# Patient Record
Sex: Male | Born: 1950 | Race: White | Marital: Single
Health system: Southern US, Community
[De-identification: ages and names within clinical notes are randomized; demographics above are authoritative.]

## PROBLEM LIST (undated history)

## (undated) DIAGNOSIS — M810 Age-related osteoporosis without current pathological fracture: Secondary | ICD-10-CM

## (undated) DIAGNOSIS — R569 Unspecified convulsions: Secondary | ICD-10-CM

## (undated) DIAGNOSIS — F0390 Unspecified dementia without behavioral disturbance: Secondary | ICD-10-CM

---

## 2012-10-20 ENCOUNTER — Other Ambulatory Visit: Payer: Self-pay | Admitting: *Deleted

## 2012-10-20 MED ORDER — LORAZEPAM 1 MG PO TABS: ORAL_TABLET | ORAL | Status: DC

## 2012-10-27 ENCOUNTER — Non-Acute Institutional Stay (SKILLED_NURSING_FACILITY): Payer: Medicare Other | Admitting: Internal Medicine

## 2012-10-27 DIAGNOSIS — F0150 Vascular dementia without behavioral disturbance: Secondary | ICD-10-CM

## 2012-10-27 DIAGNOSIS — F411 Generalized anxiety disorder: Secondary | ICD-10-CM

## 2012-10-27 DIAGNOSIS — F3289 Other specified depressive episodes: Secondary | ICD-10-CM

## 2012-10-27 DIAGNOSIS — F329 Major depressive disorder, single episode, unspecified: Secondary | ICD-10-CM

## 2012-10-27 DIAGNOSIS — K5900 Constipation, unspecified: Secondary | ICD-10-CM

## 2012-11-16 NOTE — Progress Notes (Signed)
Patient ID: Matthew [name], male   DOB: 02-25-1951, 61 y.o.   MRN: [id number]        HISTORY & PHYSICAL  DATE:  10/27/2012  FACILITY: [facility]   LEVEL OF CARE: SNF   ALLERGIES:  NKDA.   CHIEF COMPLAINT:  Manage vascular dementia, depression and constipation.    HISTORY OF PRESENT ILLNESS:  62 year-old, Caucasian male is admitted to this facility for long-term care management.  He has the following problems:  DEMENTIA: The dementia remains stable and continues to function adequately in the current living environment with supervision.  The patient has had little changes in behavior. No complications noted from the medications presently being used.  Patient is a poor historian.  The dementia is vascular type and is advanced.    DEPRESSION: The depression remains stable. Staff denies ongoing feelings of sadness, insomnia, anedhonia or lack of appetite. No complications reported from the medications currently being used. Staff do not report behavioral problems.   CONSTIPATION: The constipation remains stable. No complications from the medications presently being used.  Staff denies ongoing constipation, abdominal pain, nausea or vomiting.   PAST MEDICAL HISTORY :   Vascular dementia.    Constipation.    Depression.    Anxiety.    PAST SURGICAL HISTORY: No past surgical history on file.  SOCIAL HISTORY: Unobtainable due to advanced dementia.   FAMILY HISTORY:  Unobtainable due to advanced dementia.   CURRENT MEDICATIONS: Reviewed per [facility]  REVIEW OF SYSTEMS:  Unobtainable due to advanced dementia.   PHYSICAL EXAMINATION  VS:  T  96.2      P 70      RR 18      BP 130/85     POX%        WT (Lb) 152  GENERAL: no acute distress, normal body habitus SKIN: warm & dry, no suspicious lesions or rashes, no excessive dryness EYES: conjunctivae normal, sclerae normal, normal eye lids MOUTH/THROAT: unable to assess  NECK: supple, trachea midline, no neck masses, no thyroid  tenderness, no thyromegaly LYMPHATICS: no LAN in the neck, no supraclavicular LAN RESPIRATORY: breathing is even & unlabored, BS CTAB CARDIAC: RRR, no murmur,no extra heart sounds, no edema GI:  ABDOMEN: abdomen soft, normal BS, no masses, no tenderness  LIVER/SPLEEN: no hepatomegaly, no splenomegaly MUSCULOSKELETAL: unable to assess  PSYCHIATRIC: the patient is alert, disoriented, affect & behavior appropriate  LABS/RADIOLOGY:  None received at admission.    ASSESSMENT/PLAN:  Vascular dementia.  Advanced.  Continue current medications.  Check TSH and vitamin B12 level.    Depression.  Continue Remeron.   Constipation.  Continue lactobacillus.    Anxiety.  Continue p.r.n. Lorazepam.   Check CBC and CMP.    I have reviewed patient's medical records received at admission/from hospitalization.  CPT CODE: 29528

## 2012-11-17 ENCOUNTER — Non-Acute Institutional Stay (SKILLED_NURSING_FACILITY): Payer: Medicare Other | Admitting: Internal Medicine

## 2012-11-17 DIAGNOSIS — F411 Generalized anxiety disorder: Secondary | ICD-10-CM

## 2012-11-17 DIAGNOSIS — F329 Major depressive disorder, single episode, unspecified: Secondary | ICD-10-CM

## 2012-11-17 DIAGNOSIS — K5900 Constipation, unspecified: Secondary | ICD-10-CM

## 2012-11-17 DIAGNOSIS — F0150 Vascular dementia without behavioral disturbance: Secondary | ICD-10-CM

## 2012-11-18 NOTE — Progress Notes (Signed)
PROGRESS NOTE  DATE: 11-17-12  FACILITY: [facility]  LEVEL OF CARE: SNF  Routine Visit  CHIEF COMPLAINT:  Manage vascular dementia, constipation and depression  HISTORY OF PRESENT ILLNESS:  REASSESSMENT OF ONGOING PROBLEM(S):  1. DEMENTIA: The dementia remaines stable and continues to function adequately in the current living environment with supervision.  The patient has had little changes in behavior. No complications noted from the medications presently being used. Patient does not follow commands.  2. CONSTIPATION: The constipation remains stable. No complications from the medications presently being used. Staff deny ongoing constipation, abdominal pain, nausea or vomiting.  3. DEPRESSION: The depression remains stable.  Staff deny ongoing feelings of sadness, insomnia, anedhonia or lack of appetite. No complications reported from the medications currently being used. Staff do not report behavioral problems.  PAST MEDICAL HISTORY : Reviewed.  No changes.  CURRENT MEDICATIONS: Reviewed per [facility]  REVIEW OF SYSTEMS: Unobtainable due to dementia  PHYSICAL EXAMINATION  VS:  T 97.1       P 64      RR 16      BP 115/64     POX %     WT (Lb) 153  GENERAL: no acute distress, thin body habitus EYES: conjunctivae normal, sclerae normal, normal eye lids NECK: supple, trachea midline, no neck masses, no thyroid tenderness, no thyromegaly LYMPHATICS: no LAN in the neck, no supraclavicular LAN RESPIRATORY: breathing is even & unlabored, BS CTAB CARDIAC: RRR, no murmur,no extra heart sounds, no edema GI: abdomen soft, normal BS, no masses, no tenderness, no hepatomegaly, no splenomegaly PSYCHIATRIC: the patient is alert & disoriented, affect & behavior appropriate  LABS/RADIOLOGY:  4/14 CBC normal, glucose 119, creatinine 1.19 otherwise CMP normal, vitamin B12 738, folate greater than 19.9, TSH 1.26  ASSESSMENT/PLAN:  1. vascular dementia-advanced.  Aricept was  discontinued. 2. Constipation-no concerns from the staff. 3. Depression- stable. 4. anxiety-stable. 5. V58.69-Check hemoglobin A1c.  CPT CODE: 16109

## 2012-12-06 DIAGNOSIS — F411 Generalized anxiety disorder: Secondary | ICD-10-CM | POA: Insufficient documentation

## 2012-12-06 DIAGNOSIS — F329 Major depressive disorder, single episode, unspecified: Secondary | ICD-10-CM | POA: Insufficient documentation

## 2012-12-06 DIAGNOSIS — K5900 Constipation, unspecified: Secondary | ICD-10-CM | POA: Insufficient documentation

## 2012-12-06 DIAGNOSIS — F0150 Vascular dementia without behavioral disturbance: Secondary | ICD-10-CM | POA: Insufficient documentation

## 2012-12-20 ENCOUNTER — Non-Acute Institutional Stay (SKILLED_NURSING_FACILITY): Payer: Medicare Other | Admitting: Internal Medicine

## 2012-12-20 DIAGNOSIS — K5900 Constipation, unspecified: Secondary | ICD-10-CM

## 2012-12-20 DIAGNOSIS — F0150 Vascular dementia without behavioral disturbance: Secondary | ICD-10-CM

## 2012-12-20 DIAGNOSIS — F411 Generalized anxiety disorder: Secondary | ICD-10-CM

## 2012-12-20 DIAGNOSIS — F329 Major depressive disorder, single episode, unspecified: Secondary | ICD-10-CM

## 2012-12-20 NOTE — Progress Notes (Signed)
PROGRESS NOTE  DATE: 12-20-12  FACILITY: [facility]  LEVEL OF CARE: SNF  Routine Visit  CHIEF COMPLAINT:  Manage vascular dementia, constipation and depression  HISTORY OF PRESENT ILLNESS:  REASSESSMENT OF ONGOING PROBLEM(S):  1. DEMENTIA: The dementia remaines stable and continues to function adequately in the current living environment with supervision.  The patient has had little changes in behavior. No complications noted from the medications presently being used. Patient does not follow commands.  2. CONSTIPATION: The constipation remains stable. No complications from the medications presently being used. Staff deny ongoing constipation, abdominal pain, nausea or vomiting.  3. DEPRESSION: The depression remains stable.  Staff deny ongoing feelings of sadness, insomnia, anedhonia or lack of appetite. No complications reported from the medications currently being used. Staff do not report behavioral problems.  PAST MEDICAL HISTORY : Reviewed.  No changes.  CURRENT MEDICATIONS: Reviewed per [facility]  REVIEW OF SYSTEMS: Unobtainable due to dementia  PHYSICAL EXAMINATION  VS:  T 98.4       P 64      RR 20     BP 118/76    POX %     WT (Lb) 153  GENERAL: no acute distress, thin body habitus NECK: supple, trachea midline, no neck masses, no thyroid tenderness, no thyromegaly RESPIRATORY: breathing is even & unlabored, BS CTAB CARDIAC: RRR, no murmur,no extra heart sounds, no edema GI: abdomen soft, normal BS, no masses, no tenderness, no hepatomegaly, no splenomegaly PSYCHIATRIC: the patient is alert & disoriented, affect & behavior appropriate  LABS/RADIOLOGY: 5-14 hemoglobin A1c 6  4/14 CBC normal, glucose 119, creatinine 1.19 otherwise CMP normal, vitamin B12 738, folate greater than 19.9, TSH 1.26  ASSESSMENT/PLAN:  1. vascular dementia-advanced.  Namenda was increased, Depakote and Risperdal started. Seroquel discontinued. 2. Constipation-no concerns from the  staff. 3. Depression- stable. 4. anxiety-stable. 5. V58.69-Check Depakote level.  CPT CODE: 40981

## 2013-01-24 ENCOUNTER — Non-Acute Institutional Stay (SKILLED_NURSING_FACILITY): Payer: Medicare Other | Admitting: Internal Medicine

## 2013-01-24 DIAGNOSIS — K5900 Constipation, unspecified: Secondary | ICD-10-CM

## 2013-01-24 DIAGNOSIS — F411 Generalized anxiety disorder: Secondary | ICD-10-CM

## 2013-01-24 DIAGNOSIS — F329 Major depressive disorder, single episode, unspecified: Secondary | ICD-10-CM

## 2013-01-24 DIAGNOSIS — F0150 Vascular dementia without behavioral disturbance: Secondary | ICD-10-CM

## 2013-01-25 ENCOUNTER — Encounter: Payer: Self-pay | Admitting: Adult Health

## 2013-01-25 ENCOUNTER — Non-Acute Institutional Stay (SKILLED_NURSING_FACILITY): Payer: Medicare Other | Admitting: Adult Health

## 2013-01-25 DIAGNOSIS — F0151 Vascular dementia with behavioral disturbance: Secondary | ICD-10-CM

## 2013-01-25 DIAGNOSIS — F329 Major depressive disorder, single episode, unspecified: Secondary | ICD-10-CM

## 2013-01-25 DIAGNOSIS — F0150 Vascular dementia without behavioral disturbance: Secondary | ICD-10-CM

## 2013-01-25 DIAGNOSIS — F411 Generalized anxiety disorder: Secondary | ICD-10-CM

## 2013-01-25 NOTE — Progress Notes (Signed)
Patient ID: Matthew [name], male   DOB: 1950/12/23, 62 y.o.   MRN: [id number]  [facility]  No Known Allergies   Chief Complaint  Patient presents with  . Discharge Note    HPI: He is being discharged to an assisted living facility in order to be closer to his family; also the other facility is better able to manage his behavioral issues; such as urinating on the floor. He will not need home health or dme; will need prescriptions to be written.    No past medical history on file.  No past surgical history on file.  VITAL SIGNS BP 114/66  Pulse 70  Ht 5\' 8"  (1.727 m)  Wt 115 lb (52.164 kg)  BMI 17.49 kg/m2   Patient's Medications  New Prescriptions   No medications on file  Previous Medications   DIVALPROEX (DEPAKOTE SPRINKLE) 125 MG CAPSULE    Take 125 mg by mouth 2 (two) times daily.   LACTOBACILLUS ACIDOPHILUS & BULGAR (LACTINEX) CHEWABLE TABLET    Chew 1 tablet by mouth 3 (three) times daily with meals.   LORAZEPAM (ATIVAN) 1 MG TABLET    Take one tablet by mouth every 6 hours as needed for agitation   MEMANTINE HCL ER (NAMENDA XR) 14 MG CP24    Take 14 mg by mouth daily.   MIRTAZAPINE (REMERON) 15 MG TABLET    Take 15 mg by mouth at bedtime.   MULTIPLE VITAMIN (MULTIVITAMIN) TABLET    Take 1 tablet by mouth daily.   RISPERIDONE (RISPERDAL) 1 MG TABLET    Take 1 mg by mouth 2 (two) times daily.   RIVASTIGMINE (EXELON) 9.5 MG/24HR    Place 1 patch onto the skin daily.  Modified Medications   No medications on file  Discontinued Medications   No medications on file    SIGNIFICANT DIAGNOSTIC EXAMS  10-31-12: wbc 8.6; hgb 14.9; hct 43.9; mcv 94; plt 190; glucose 119; bun 12; creat 1.09; k+ 4.3 Na++ 139; liver normal albumin 4.1  11-22-12: hgb a1c 6.0 12-30-12: depakote 17   Review of Systems  Unable to perform ROS   Physical Exam  Constitutional: He appears well-developed and well-nourished.  Neck: Neck supple. No JVD present. No thyromegaly present.   Cardiovascular: Normal rate, regular rhythm and intact distal pulses.   Respiratory: Effort normal and breath sounds normal. No respiratory distress.  GI: Soft. Bowel sounds are normal. He exhibits no distension. There is no tenderness.  Musculoskeletal: Normal range of motion.  Neurological: He is alert.  Skin: Skin is warm and dry.      ASSESSMENT/ PLAN:  Will discharge him to assisted living in order to be closer to his family no home health; no dme; prescriptions have been written

## 2013-01-26 NOTE — Progress Notes (Signed)
PROGRESS NOTE  DATE: 01-24-13  FACILITY: [facility]  LEVEL OF CARE: SNF  Routine Visit  CHIEF COMPLAINT:  Manage vascular dementia, constipation and depression  HISTORY OF PRESENT ILLNESS:  REASSESSMENT OF ONGOING PROBLEM(S):  DEMENTIA: The dementia remaines stable and continues to function adequately in the current living environment with supervision.  The patient has had little changes in behavior. No complications noted from the medications presently being used. Patient does not follow commands.  CONSTIPATION: The constipation remains stable. No complications from the medications presently being used. Staff deny ongoing constipation, abdominal pain, nausea or vomiting.  DEPRESSION: The depression remains stable.  Staff deny ongoing feelings of sadness, insomnia, anedhonia or lack of appetite. No complications reported from the medications currently being used. Staff do not report behavioral problems.  PAST MEDICAL HISTORY : Reviewed.  No changes.  CURRENT MEDICATIONS: Reviewed per [facility]  REVIEW OF SYSTEMS: Unobtainable due to dementia  PHYSICAL EXAMINATION  VS:  T 97.6       P 65      RR 20     BP 114/66    POX %     WT (Lb) 155  GENERAL: no acute distress, thin body habitus NECK: supple, trachea midline, no neck masses, no thyroid tenderness, no thyromegaly RESPIRATORY: breathing is even & unlabored, BS CTAB CARDIAC: RRR, no murmur,no extra heart sounds, no edema GI: abdomen soft, normal BS, no masses, no tenderness, no hepatomegaly, no splenomegaly PSYCHIATRIC: the patient is alert & disoriented, affect & behavior appropriate  LABS/RADIOLOGY:  6/14 depakote level 17 5-14 hemoglobin A1c 6  4/14 CBC normal, glucose 119, creatinine 1.19 otherwise CMP normal, vitamin B12 738, folate greater than 19.9, TSH 1.26  ASSESSMENT/PLAN:  vascular dementia-advanced.  Constipation-no concerns from the staff. Depression- stable. anxiety-stable.  CPT CODE: 16109

## 2013-03-14 ENCOUNTER — Emergency Department (HOSPITAL_COMMUNITY)
Admission: EM | Admit: 2013-03-14 | Discharge: 2013-03-15 | Disposition: A | Discharge status: Home / Self Care | Payer: Self-pay | Attending: Emergency Medicine | Admitting: Emergency Medicine

## 2013-03-14 ENCOUNTER — Emergency Department (HOSPITAL_COMMUNITY): Payer: Self-pay

## 2013-03-14 ENCOUNTER — Encounter (HOSPITAL_COMMUNITY): Payer: Self-pay

## 2013-03-14 DIAGNOSIS — Z8739 Personal history of other diseases of the musculoskeletal system and connective tissue: Secondary | ICD-10-CM | POA: Insufficient documentation

## 2013-03-14 DIAGNOSIS — M79609 Pain in unspecified limb: Secondary | ICD-10-CM | POA: Insufficient documentation

## 2013-03-14 DIAGNOSIS — M79605 Pain in left leg: Secondary | ICD-10-CM

## 2013-03-14 DIAGNOSIS — Z79899 Other long term (current) drug therapy: Secondary | ICD-10-CM | POA: Insufficient documentation

## 2013-03-14 DIAGNOSIS — F0390 Unspecified dementia without behavioral disturbance: Secondary | ICD-10-CM | POA: Insufficient documentation

## 2013-03-14 HISTORY — DX: Unspecified dementia, unspecified severity, without behavioral disturbance, psychotic disturbance, mood disturbance, and anxiety: F03.90

## 2013-03-14 HISTORY — DX: Age-related osteoporosis without current pathological fracture: M81.0

## 2013-03-14 LAB — URINALYSIS, ROUTINE W REFLEX MICROSCOPIC
Nitrite: NEGATIVE
Protein, ur: NEGATIVE mg/dL
Specific Gravity, Urine: 1.032 — ABNORMAL HIGH (ref 1.005–1.030)
Urobilinogen, UA: 1 mg/dL (ref 0.0–1.0)

## 2013-03-14 LAB — URINE MICROSCOPIC-ADD ON

## 2013-03-14 LAB — BASIC METABOLIC PANEL
Chloride: 99 mEq/L (ref 96–112)
GFR calc Af Amer: >90 mL/min (ref >90)
Potassium: 3.7 mEq/L (ref 3.5–5.1)
Sodium: 137 mEq/L (ref 135–145)

## 2013-03-14 LAB — CBC WITH DIFFERENTIAL/PLATELET
Basophils Absolute: 0 10*3/uL (ref 0.0–0.1)
Basophils Relative: 0 % (ref 0–1)
Hemoglobin: 14.4 g/dL (ref 13.0–17.0)
MCHC: 34 g/dL (ref 30.0–36.0)
Monocytes Relative: 11 % (ref 3–12)
Neutro Abs: 6.6 10*3/uL (ref 1.7–7.7)
Neutrophils Relative %: 74 % (ref 43–77)
Platelets: 145 10*3/uL — ABNORMAL LOW (ref 150–400)
RDW: 11.9 % (ref 11.5–15.5)

## 2013-03-14 LAB — TROPONIN I: Troponin I: <0.3 ng/mL (ref <0.30)

## 2013-03-14 MED ORDER — HALOPERIDOL LACTATE 5 MG/ML IJ SOLN
5.0000 mg | Freq: Once | INTRAMUSCULAR | Status: AC
  Administered 2013-03-14: 5 mg via INTRAMUSCULAR
  Filled 2013-03-14: qty 1

## 2013-03-14 MED ORDER — LORAZEPAM 2 MG/ML IJ SOLN
2.0000 mg | Freq: Once | INTRAMUSCULAR | Status: AC
  Administered 2013-03-14: 2 mg via INTRAMUSCULAR
  Filled 2013-03-14 (×2): qty 1

## 2013-03-14 NOTE — ED Notes (Signed)
Per EMS- Patient is a resident of [address]. Patient was gotten up by staff for dinner and patient would not put left leg down to walk. Staff reported that the patient normally sleeps and sits with his knees bent. EMS attempted to stand patient and he became combative and would not put left leg on the floor to walk. Patient has a history of dementia. Staff reports no injuries or falls.

## 2013-03-14 NOTE — ED Notes (Signed)
Soft limb holders applied to lower extremities.

## 2013-03-14 NOTE — ED Notes (Signed)
Bed: WA13 Expected date:  Expected time:  Means of arrival:  Comments: ems  

## 2013-03-14 NOTE — ED Notes (Signed)
Patient to get out of bed. Patient speaking incoherently, not following commands. Soft wrist restraints are in place to bilateral upper extremities. Patient kicking legs in the air. Patient re-directed to place and time but continues to kick legs.

## 2013-03-15 ENCOUNTER — Emergency Department (HOSPITAL_COMMUNITY): Payer: Self-pay

## 2013-03-15 ENCOUNTER — Telehealth (HOSPITAL_COMMUNITY): Payer: Self-pay | Admitting: Emergency Medicine

## 2013-03-15 ENCOUNTER — Encounter (HOSPITAL_COMMUNITY): Payer: Self-pay | Admitting: Radiology

## 2013-03-15 LAB — VALPROIC ACID LEVEL: Valproic Acid Lvl: <10 ug/mL — ABNORMAL LOW (ref 50.0–100.0)

## 2013-03-15 MED ORDER — IBUPROFEN 600 MG PO TABS: 600.0000 mg | ORAL_TABLET | Freq: Three times a day (TID) | ORAL | Status: DC | PRN

## 2013-03-15 MED ORDER — KETOROLAC TROMETHAMINE 30 MG/ML IJ SOLN: 30.0000 mg | Freq: Once | INTRAMUSCULAR | Status: DC

## 2013-03-15 MED ORDER — KETOROLAC TROMETHAMINE 60 MG/2ML IM SOLN
60.0000 mg | Freq: Once | INTRAMUSCULAR | Status: AC
  Administered 2013-03-15: 60 mg via INTRAMUSCULAR
  Filled 2013-03-15: qty 2

## 2013-03-15 NOTE — ED Notes (Signed)
Soft restraints to right ankle and left ankle removed at 0144.

## 2013-03-15 NOTE — ED Provider Notes (Signed)
12:52 AM Care from Dr. Rhunette Croft.  Patient was given Geodon given his increasing agitation.  Plain films of his left hip and left leg are normal.  The patient does have a history of renal colic and does have noted hematuria.  Given the patient's history of dementia this could be the presentation of left renal colic and therefore CT scan was obtained.  The patient wanted to be evaluated further when he is no longer under the influence of his Geodon.  Vital signs without significant abnormality this time.  Normal pulses in left foot.  3:02 AM Full examination of left lower extremity performed.  Normal PT and DP pulses left foot.  No ischemic changes of his left lower extremity.  No erythema warmth or focal tenderness.  There is mild discomfort with range of motion of his left hip and left knee.  I am however able to fully range both his left hip and his left knee.  No discomfort with range of motion of his left ankle.  No left knee effusion present.  No rash present.  No lymphadenopathy noted in his left groin.  No unilateral leg swelling.  CT scan without pathology.  Doubt septic joint.  Doubt DVT.  No signs of arterial insufficiency.  Patient will need to followup with his primary care physician.  Toradol given in the emergency department.  Home with instructions for facility to prescribe ibuprofen 60 mg every 8 hours when necessary pain.  No indication for additional imaging.  Doubt deep space infection.  No indication for admission.  Patient has dementia and therefore is unable to provide additional history.  Lyanne Co, MD 03/15/13 [phone]

## 2013-03-15 NOTE — ED Notes (Addendum)
Please contact patient's mother, Dontavius Keim [phone], for any changes in patient's status.

## 2013-03-15 NOTE — ED Provider Notes (Signed)
CSN: [id number]     Arrival date & time 03/14/13  4098 History   First MD Initiated Contact with Patient 03/14/13 1906     Chief Complaint  Patient presents with  . Leg Pain   (Consider location/radiation/quality/duration/timing/severity/associated sxs/prior Treatment) HPI Comments: LEVEL 5 CAVEAT FOR DEMENTIA, COMBATIVENESS Pt comes in with cc of leg pain. Pt has dementia hx, and he comes from nursing home. Per EMS, nursing home noted that the patient would not stand due to, what appears to be left leg pain. When asked to walk, he became combative - and patient was sent to the ED. Pt was combative the whole way to the ED, and when we observed him here.  Patient is a 62 y.o. male presenting with leg pain. The history is provided by the patient.  Leg Pain   Past Medical History  Diagnosis Date  . Dementia   . Dementia   . Osteoporosis    No past surgical history on file. No family history on file. History  Substance Use Topics  . Smoking status: Unknown If Ever Smoked  . Smokeless tobacco: Not on file  . Alcohol Use: Not on file    Review of Systems  Unable to perform ROS: Dementia    Allergies  Review of patient's allergies indicates no known allergies.  Home Medications   Current Outpatient Rx  Name  Route  Sig  Dispense  Refill  . divalproex (DEPAKOTE SPRINKLE) 125 MG capsule   Oral   Take 375 mg by mouth 3 (three) times daily.          Marland Kitchen lactobacillus acidophilus & bulgar (LACTINEX) chewable tablet   Oral   Chew 2 tablets by mouth 3 (three) times daily with meals.          Marland Kitchen LORazepam (ATIVAN) 1 MG tablet   Oral   Take 1 mg by mouth every 6 (six) hours as needed (for agitation).         . Memantine HCl ER (NAMENDA XR) 14 MG CP24   Oral   Take 14 mg by mouth daily.         . mirtazapine (REMERON) 15 MG tablet   Oral   Take 15 mg by mouth every morning.          . risperiDONE (RISPERDAL) 1 MG/ML oral solution   Oral   Take 0.5 mg by mouth 3  (three) times daily.         . rivastigmine (EXELON) 9.5 mg/24hr   Transdermal   Place 1 patch onto the skin daily.          BP 114/67  Pulse 72  Temp(Src) 97.5 F (36.4 C) (Oral)  Resp 18  SpO2 94% Physical Exam  Nursing note and vitals reviewed. Constitutional: He appears well-developed.  HENT:  Head: Normocephalic and atraumatic.  Eyes: Conjunctivae are normal.  Neck: Normal range of motion.  Cardiovascular: Normal rate.   Pulmonary/Chest: Effort normal. No respiratory distress.  Abdominal: Soft. He exhibits no distension. There is no tenderness.  Musculoskeletal: Normal range of motion.  Head to toe evaluation shows no hematoma, bleeding of the scalp, no facial abrasions, step offs, crepitus, no tenderness to palpation of the bilateral upper and lower extremities, no gross deformities, no chest tenderness, no pelvic pain.  Patient was kicking, and appears in no distress when we were palpating the legs.    ED Course  Procedures (including critical care time) Labs Review Labs Reviewed  CBC WITH DIFFERENTIAL -  Abnormal; Notable for the following:    Platelets 145 (*)    All other components within normal limits  BASIC METABOLIC PANEL - Abnormal; Notable for the following:    Glucose, Bld 107 (*)    GFR calc non Af Amer 78 (*)    All other components within normal limits  URINALYSIS, ROUTINE W REFLEX MICROSCOPIC - Abnormal; Notable for the following:    Specific Gravity, Urine 1.032 (*)    Hgb urine dipstick LARGE (*)    All other components within normal limits  TROPONIN I  URINE MICROSCOPIC-ADD ON   Imaging Review Dg Hip Complete Left  03/14/2013   *RADIOLOGY REPORT*  Clinical Data: Left leg pain  LEFT HIP - COMPLETE 2+ VIEW  Comparison: None.  Findings: No acute fracture or dislocation is noted.  No gross soft tissue abnormality is seen.  IMPRESSION: No acute abnormality noted.   Original Report Authenticated By: Alcide Clever, M.D.   Dg Femur Left  03/14/2013    *RADIOLOGY REPORT*  Clinical Data: Left leg pain  LEFT FEMUR - 2 VIEW  Comparison: None.  Findings: No acute fracture or dislocation is noted.  No soft tissue changes are seen.  IMPRESSION: No acute abnormality noted.   Original Report Authenticated By: Alcide Clever, M.D.   Dg Tibia/fibula Left  03/14/2013   *RADIOLOGY REPORT*  Clinical Data: Left leg pain  LEFT TIBIA AND FIBULA - 2 VIEW  Comparison: None.  Findings: No acute fracture or dislocation is identified.  No gross soft tissue abnormality is seen.  IMPRESSION: No acute abnormality noted.   Original Report Authenticated By: Alcide Clever, M.D.   Dg Chest Portable 1 View  03/14/2013   *RADIOLOGY REPORT*  Clinical Data: Change in mental status.  PORTABLE CHEST - 1 VIEW  Comparison: None.  Findings: The lungs are clear.  No edema, infiltrate or pleural fluid is identified.  Heart size is normal.  The aorta is mildly ectatic.  Visualized bony structures are unremarkable.  IMPRESSION: No active disease.   Original Report Authenticated By: Irish Lack, M.D.    MDM  No diagnosis found.  Pt comes in with cc of leg pain, per nursing home. Pt arrives combative. He is kicking around - no deformity seen, and the movement of the legs are symmetric and equal. Our exam of the leg is normal. No signs of infection. I had to restrain the patient, physical initially, and then chemical. Pt calmed down, Xrays were done, they are negative as well.  Pt is asleep - family spoken too. Has hx of renal stones, so asked Dr. Patria Mane to reassess when patient alert, and get a CT renal stone protocol if needed.  Basic labs are [facility].  Derwood Kaplan, MD 03/15/13 1542

## 2013-03-15 NOTE — ED Notes (Signed)
Patient returned from CT; family at bedside.

## 2013-03-15 NOTE — ED Notes (Signed)
Pt is not compliant for vital signs.  Informed RN.

## 2013-03-15 NOTE — ED Notes (Signed)
Report given to [facility]. [employer] contacted for transportation.

## 2013-05-17 ENCOUNTER — Emergency Department (HOSPITAL_COMMUNITY): Payer: Medicare Other

## 2013-05-17 ENCOUNTER — Encounter (HOSPITAL_COMMUNITY): Payer: Self-pay | Admitting: Emergency Medicine

## 2013-05-17 ENCOUNTER — Emergency Department (HOSPITAL_COMMUNITY)
Admission: EM | Admit: 2013-05-17 | Discharge: 2013-05-19 | Disposition: A | Discharge status: Home / Self Care | Payer: Medicare Other | Attending: Emergency Medicine | Admitting: Emergency Medicine

## 2013-05-17 DIAGNOSIS — F911 Conduct disorder, childhood-onset type: Secondary | ICD-10-CM | POA: Insufficient documentation

## 2013-05-17 DIAGNOSIS — F0390 Unspecified dementia without behavioral disturbance: Secondary | ICD-10-CM | POA: Insufficient documentation

## 2013-05-17 DIAGNOSIS — Z8739 Personal history of other diseases of the musculoskeletal system and connective tissue: Secondary | ICD-10-CM | POA: Insufficient documentation

## 2013-05-17 DIAGNOSIS — F69 Unspecified disorder of adult personality and behavior: Secondary | ICD-10-CM

## 2013-05-17 DIAGNOSIS — F39 Unspecified mood [affective] disorder: Secondary | ICD-10-CM | POA: Insufficient documentation

## 2013-05-17 DIAGNOSIS — Z79899 Other long term (current) drug therapy: Secondary | ICD-10-CM | POA: Insufficient documentation

## 2013-05-17 LAB — VALPROIC ACID LEVEL: Valproic Acid Lvl: 10 ug/mL — ABNORMAL LOW (ref 50.0–100.0)

## 2013-05-17 LAB — CBC WITH DIFFERENTIAL/PLATELET
Basophils Absolute: 0 10*3/uL (ref 0.0–0.1)
Basophils Relative: 0 % (ref 0–1)
Eosinophils Absolute: 0.1 10*3/uL (ref 0.0–0.7)
HCT: 41.6 % (ref 39.0–52.0)
MCH: 31.8 pg (ref 26.0–34.0)
MCHC: 34.4 g/dL (ref 30.0–36.0)
Monocytes Absolute: 0.5 10*3/uL (ref 0.1–1.0)
Neutro Abs: 7.6 10*3/uL (ref 1.7–7.7)
Neutrophils Relative %: 85 % — ABNORMAL HIGH (ref 43–77)
RDW: 12.3 % (ref 11.5–15.5)

## 2013-05-17 LAB — BASIC METABOLIC PANEL
BUN: 17 mg/dL (ref 6–23)
Chloride: 102 mEq/L (ref 96–112)
Creatinine, Ser: 0.9 mg/dL (ref 0.50–1.35)
GFR calc Af Amer: >90 mL/min (ref >90)
GFR calc non Af Amer: 89 mL/min — ABNORMAL LOW (ref >90)

## 2013-05-17 LAB — ETHANOL: Alcohol, Ethyl (B): <11 mg/dL (ref 0–11)

## 2013-05-17 LAB — SALICYLATE LEVEL: Salicylate Lvl: <2 mg/dL — ABNORMAL LOW (ref 2.8–20.0)

## 2013-05-17 LAB — ACETAMINOPHEN LEVEL: Acetaminophen (Tylenol), Serum: <15 ug/mL (ref 10–30)

## 2013-05-17 MED ORDER — RISPERIDONE 1 MG/ML PO SOLN
0.5000 mg | Freq: Three times a day (TID) | ORAL | Status: DC
Start: 2013-05-17 — End: 2013-05-19
  Administered 2013-05-17: 1 mg via ORAL
  Administered 2013-05-17 – 2013-05-18 (×2): 0.5 mg via ORAL
  Administered 2013-05-18: 16:00:00 via ORAL
  Administered 2013-05-18: 1 mg via ORAL
  Administered 2013-05-19 (×2): 0.5 mg via ORAL
  Filled 2013-05-17 (×8): qty 0.5

## 2013-05-17 MED ORDER — LORAZEPAM 1 MG PO TABS
1.0000 mg | ORAL_TABLET | Freq: Four times a day (QID) | ORAL | Status: DC | PRN
  Administered 2013-05-17 – 2013-05-19 (×5): 1 mg via ORAL
  Filled 2013-05-17 (×5): qty 1

## 2013-05-17 MED ORDER — DIVALPROEX SODIUM 125 MG PO CPSP
125.0000 mg | ORAL_CAPSULE | Freq: Two times a day (BID) | ORAL | Status: DC
  Administered 2013-05-18 – 2013-05-19 (×3): 125 mg via ORAL
  Filled 2013-05-17 (×4): qty 1

## 2013-05-17 MED ORDER — DIVALPROEX SODIUM 125 MG PO CPSP
500.0000 mg | ORAL_CAPSULE | Freq: Once | ORAL | Status: AC
  Administered 2013-05-17: 500 mg via ORAL
  Filled 2013-05-17: qty 4

## 2013-05-17 NOTE — ED Notes (Signed)
Tried to perform in and out cath. Pt very combative and was unable to catheterize. MD Criss Alvine made aware

## 2013-05-17 NOTE — BH Assessment (Signed)
Assessment Note  Matthew [name] is an 62 y.o. male. Pt brought here from [facility] in [place], [phone].  Pt unable to answer any questions but ACT did hear him speak to his elderly mother as ACT was exiting the room.  Info from mother and also from staff at nursing home.  Mother reports pt is Matthew [name] vet who spent 9 months hospitalized at [facility] from 01/2012 until 10/2012.  Pt has since been in 2 nursing homes, at [facility] for about 2 months.  She was unaware that he had been refusing his meds until she was informed of this today.  She reports no mental health history until the dementia appeared about 2 years ago.  Pt does have an attorney who is his legal guardian, [name] of [place], [phone].  Pt's mother reports he has seemed happy during her visits.  [name], staff at [facility], reports pt has been refusing meds for the past month.  Pt is also very combative to any attempts at care. It takes 5-6 people to get him to bathe, dress, or attempt to use the toilet.  PT is completely incontinent and often poops on the floor.  She reports he had not threatened anyone until this morning when he walked into the dayroom and started to point out other residents and tell them he was going to kill them.  Pt feeds himself and is ambulatory but needs help with all other ADLs.  Pt did not respond to any questions.  No reported SI/AV.   Axis I: dementia Axis II: Deferred Axis III:  Past Medical History  Diagnosis Date  . Dementia   . Dementia   . Osteoporosis    Axis IV: none Axis V: 21-30 behavior considerably influenced by delusions or hallucinations OR serious impairment in judgment, communication OR inability to function in almost all areas  Past Medical History:  Past Medical History  Diagnosis Date  . Dementia   . Dementia   . Osteoporosis     History reviewed. No pertinent past surgical history.  Family History: History reviewed. No pertinent family  history.  Social History:  has no tobacco, alcohol, and drug history on file.  Additional Social History:  Alcohol / Drug Use Pain Medications: In nursing home.  No use. History of alcohol / drug use?: No history of alcohol / drug abuse  CIWA: CIWA-Ar BP: 147/81 mmHg Pulse Rate: 93 COWS:    Allergies: No Known Allergies  Home Medications:  (Not in a hospital admission)  OB/GYN Status:  No LMP for male patient.  General Assessment Data Location of Assessment: [facility] ED ACT Assessment: Yes Is this a Tele or Face-to-Face Assessment?: Face-to-Face Is this an Initial Assessment or a Re-assessment for this encounter?: Initial Assessment Living Arrangements: Other (Comment) (nursing home) Can pt return to current living arrangement?:  (I did not ask)     [facility] Plan Living Arrangements: Other (Comment) (nursing home)     Risk to self Suicidal Ideation: No Suicidal Intent: No Is patient at risk for suicide?: No Suicidal Plan?: No Access to Means: No What has been your use of drugs/alcohol within the last 12 months?: no current use Previous Attempts/Gestures: No Intentional Self Injurious Behavior: None Family Suicide History: No Recent stressful life event(s):  (none, per nursing home staff) Persecutory voices/beliefs?: No Depression: No Substance abuse history and/or treatment for substance abuse?: No Suicide prevention information given to non-admitted patients: Not applicable  Risk to Others Homicidal Ideation:  Yes-Currently Present Thoughts of Harm to Others: Yes-Currently Present Comment - Thoughts of Harm to Others: pt made threats to other residents today Current Homicidal Intent: No Current Homicidal Plan: No Access to Homicidal Means: No Identified Victim: other nursing home residents History of harm to others?: Yes Adult nurse) Assessment of Violence: In distant past Does patient have access to weapons?: No Criminal Charges Pending?: No Does  patient have a court date: No  Psychosis Hallucinations: None noted Delusions: None noted  Mental Status Report Appear/Hygiene: Disheveled Eye Contact: Poor Motor Activity: Other (Comment) (in restraints) Speech: Unable to assess Level of Consciousness: Alert Mood: Other (Comment) (unable to assess) Affect: Unable to Assess Anxiety Level: None Thought Processes:  (unable to assess) Judgement: Impaired Orientation: Unable to assess;Not oriented  Cognitive Functioning Concentration: Normal Memory: Recent Impaired;Remote Impaired IQ: Below Average Level of Function: dementia patient Insight: Poor Impulse Control: Poor Appetite:  (unknown) Sleep:  (unknown) Vegetative Symptoms: None  ADLScreening [facility] Assessment Services) Patient's cognitive ability adequate to safely complete daily activities?: No Patient able to express need for assistance with ADLs?: No Independently performs ADLs?: No  Prior Inpatient Therapy Prior Inpatient Therapy: Yes Prior Therapy Dates: 01/2012-11/2012 Prior Therapy Facilty/Provider(s): [facility] Reason for Treatment: dementia  Prior Outpatient Therapy Prior Outpatient Therapy: No  ADL Screening (condition at time of admission) Patient's cognitive ability adequate to safely complete daily activities?: No Is the patient deaf or have difficulty hearing?: No Does the patient have difficulty seeing, even when wearing glasses/contacts?: No Does the patient have difficulty concentrating, remembering, or making decisions?: Yes Patient able to express need for assistance with ADLs?: No Does the patient have difficulty dressing or bathing?: Yes Independently performs ADLs?: No Communication: Dependent Is this a change from baseline?: Pre-admission baseline Dressing (OT): Dependent Is this a change from baseline?: Pre-admission baseline Grooming: Dependent Is this a change from baseline?: Pre-admission baseline Feeding: Independent Bathing:  Dependent Is this a change from baseline?: Pre-admission baseline Toileting: Dependent Is this a change from baseline?: Pre-admission baseline In/Out Bed: Independent Walks in Home: Independent       Abuse/Neglect Assessment (Assessment to be complete while patient is alone) Physical Abuse: Denies Verbal Abuse: Denies Sexual Abuse: Denies Exploitation of patient/patient's resources: Denies Self-Neglect: Denies Values / Beliefs Cultural Requests During Hospitalization: None Spiritual Requests During Hospitalization: None   Advance Directives (For Healthcare) Advance Directive: Patient does not have advance directive (unable to discuss this)    Additional Information 1:1 In Past 12 Months?:  (unknown) CIRT Risk: Yes Elopement Risk: Yes Does patient have medical clearance?: Yes     Disposition: Disposition Initial Assessment Completed for this Encounter: Yes  On Site Evaluation by:   Reviewed with Physician:    Lorri Frederick 05/17/2013 4:34 PM

## 2013-05-17 NOTE — Progress Notes (Signed)
Matthew [name], MHT completed placement search for patient by contacting the following facilities listed below;   [facility] spoke with Morrie Sheldon who reports to fax for review Hazel Sams reports at [employer] reports at capacity [facility] no answer [facility] Hights left recorded message [facility] reports one bed available faxed for review multiple attempts made with continued fax failure notices [place] spoke with Judeth Cornfield who reports fax for review  [facility] reports back a decline due to dementia

## 2013-05-17 NOTE — ED Notes (Addendum)
Pt coming from [facility] nursing home with c/o combative behavior, threatening to kill other residents, pt has hx of dementia, brought in handcuffs with [employer]. Per nursing staff notes pt didn't have any of his medications (psych meds) since 10/02 d/t "patient refusal". Pt placed in 4 points restraint on arrival.

## 2013-05-17 NOTE — ED Notes (Signed)
Matthew [name] mother would like to be contacted with any changes or patient updates [phone]. If she can not be reached, Matthew [name] family friend can be contacted at [phone]

## 2013-05-17 NOTE — ED Provider Notes (Signed)
   Ventricular Rate:  72 PR Interval:  151 QRS Duration: 70 QT Interval:  387 QTC Calculation: 423 R Axis:   67 Text Interpretation:  Sinus rhythm Abnormal R-wave progression, early transition Baseline wander in lead(s) I III aVL No significant change since last tracing    8:53 PM Discussed with psych PA Lucianne Muss). Since his depakoate level is low will give 500 mg orally now in sprinkles and then start 125 mg BID to help control his agitation. They are currently looking for placement for patient.  Audree Camel, MD 05/17/13 2215

## 2013-05-17 NOTE — Progress Notes (Signed)
CSW attempted to contact pts guardian, Matthew [name] at [phone].  Left voicemail.  CSW attempted to complete a referral for pt at the [facility].  Pts guardian's signature is required to complete the referral.  Pt has a history of previous treatment at the [facility].  Marland KitchenMarva Panda, LCSWA  [phone]  .05/17/2013  9:15 pm

## 2013-05-17 NOTE — Progress Notes (Signed)
CSW faxed referral to  [facility], pending review [place], pending review  .Marva Panda, LCSWA  [phone]  .05/17/2013  7:00 pm

## 2013-05-17 NOTE — ED Provider Notes (Signed)
CSN: [id number]     Arrival date & time 05/17/13  1310 History   First MD Initiated Contact with Patient 05/17/13 1319     Chief Complaint  Patient presents with  . Dementia   (Consider location/radiation/quality/duration/timing/severity/associated sxs/prior Treatment) HPI Comments: Patient presents emergency department with chief complaint of HI. Patient is sent from a nursing home, after threatening the staff. Reportedly he said "I hate to have to kill you." Patient is demented at baseline, and history is difficult to elicit. Level 5 caveat applies secondary to dementia. He is accompanied by his brother and mother. They state that the patient has been off his psychiatric medications for the past month. They believe this to be reason that the patient has acting out.  The history is provided by the patient. No language interpreter was used.    Past Medical History  Diagnosis Date  . Dementia   . Dementia   . Osteoporosis    History reviewed. No pertinent past surgical history. History reviewed. No pertinent family history. History  Substance Use Topics  . Smoking status: Unknown If Ever Smoked  . Smokeless tobacco: Not on file  . Alcohol Use: Not on file    Review of Systems  Unable to perform ROS: Dementia    Allergies  Review of patient's allergies indicates no known allergies.  Home Medications   Current Outpatient Rx  Name  Route  Sig  Dispense  Refill  . divalproex (DEPAKOTE SPRINKLE) 125 MG capsule   Oral   Take 375 mg by mouth 3 (three) times daily with meals.          . lactobacillus acidophilus & bulgar (LACTINEX) chewable tablet   Oral   Chew 2 tablets by mouth 3 (three) times daily with meals.          Marland Kitchen LORazepam (ATIVAN) 1 MG tablet   Oral   Take 1 mg by mouth every 6 (six) hours as needed (for agitation).         . Memantine HCl ER (NAMENDA XR) 14 MG CP24   Oral   Take 14 mg by mouth daily.         . mirtazapine (REMERON) 15 MG tablet  Oral   Take 15 mg by mouth daily.          . risperiDONE (RISPERDAL) 1 MG/ML oral solution   Oral   Take 0.5 mg by mouth 3 (three) times daily.         . rivastigmine (EXELON) 9.5 mg/24hr   Transdermal   Place 1 patch onto the skin daily.          BP 147/81  Pulse 93  Temp(Src) 98.7 F (37.1 C) (Oral)  Resp 20  SpO2 96% Physical Exam  Nursing note and vitals reviewed. Constitutional: He is oriented to person, place, and time. He appears well-developed and well-nourished.  In restraints  HENT:  Head: Normocephalic and atraumatic.  Eyes: Conjunctivae and EOM are normal. Pupils are equal, round, and reactive to light. Right eye exhibits no discharge. Left eye exhibits no discharge. No scleral icterus.  Neck: Normal range of motion. Neck supple. No JVD present.  Cardiovascular: Normal rate, regular rhythm, normal heart sounds and intact distal pulses.  Exam reveals no gallop and no friction rub.   No murmur heard. Pulmonary/Chest: Effort normal and breath sounds normal. No respiratory distress. He has no wheezes. He has no rales. He exhibits no tenderness.  Abdominal: Soft. He exhibits no distension and no  mass. There is no tenderness. There is no rebound and no guarding.  Musculoskeletal: Normal range of motion. He exhibits no edema and no tenderness.  Neurological: He is alert and oriented to person, place, and time.  Skin: Skin is warm and dry.  Psychiatric:  Demented    ED Course  Procedures (including critical care time) Labs Review Labs Reviewed  CBC WITH DIFFERENTIAL  BASIC METABOLIC PANEL  URINE RAPID DRUG SCREEN (HOSP PERFORMED)  ETHANOL  ACETAMINOPHEN LEVEL  SALICYLATE LEVEL   Imaging Review No results found.  EKG Interpretation   None       MDM  No diagnosis found.  Patient with dementia. Lives at the nursing home. Reportedly threatened staff. Has been off his medications for approximately one month.  Discussed the patient with Dr. Anitra Lauth,  who recommends TTS consultation.  Will check labs, and move to psych.   4:05 PM Patient signed out to Dr. Criss Alvine, who will continue care.  Plan:   1. Follow-up on labs 2. Medically clear 3. TTS consult pending   Roxy Horseman, PA-C 05/17/13 1606

## 2013-05-17 NOTE — Consult Note (Addendum)
[facility] Face-to-Face Psychiatry Consult   Reason for Consult:  Aggressive and threatening behavior Referring Physician:  EDP  Matthew [name] is an 62 y.o. male.  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 Dementia   . Dementia   . Osteoporosis    AXIS IV:  other psychosocial or environmental problems AXIS V:  21-30 behavior considerably influenced by delusions or hallucinations OR serious impairment in judgment, communication OR inability to function in almost all areas  Plan:  Recommend psychiatric Inpatient admission when medically cleared.  Subjective:   Matthew [name] is a 62 y.o. male.  HPI:  Patient would not speak.  Mother at bed side and states that staff at nursing home said that patient has been refusing his medication for a month.  States that the patient also threaten staff.  Mother and brother states that this behavior is unlike patient and that patient doesn't speak much.  States that the nursing home also has a lot of new staff.  Both in agreement with patient being inpatient to get stabilized back on medications and then sent back to facility.  HPI Elements:   Location:  [facility] ED. Quality:  Affecting patient mentally . Severity:  Refusing medication and threatening staff.  Past Psychiatric History: Past Medical History  Diagnosis Date  . Dementia   . Dementia   . Osteoporosis     has no tobacco, alcohol, and drug history on file. History reviewed. No pertinent family history. Family History Substance Abuse: No Family Supports:  (mother, legal guardian ) Living Arrangements: Other (Comment) (nursing home) Can pt return to current living arrangement?:  (I did not ask) Abuse/Neglect [facility]) Physical Abuse: Denies Verbal Abuse: Denies Sexual Abuse: Denies Allergies:  No Known Allergies  ACT Assessment Complete:  Yes:    Educational Status    Risk to Self: Risk to  self Suicidal Ideation: No Suicidal Intent: No Is patient at risk for suicide?: No Suicidal Plan?: No Access to Means: No What has been your use of drugs/alcohol within the last 12 months?: no current use Previous Attempts/Gestures: No Intentional Self Injurious Behavior: None Family Suicide History: No Recent stressful life event(s):  (none, per nursing home staff) Persecutory voices/beliefs?: No Depression: No Substance abuse history and/or treatment for substance abuse?: No Suicide prevention information given to non-admitted patients: Not applicable  Risk to Others: Risk to Others Homicidal Ideation: Yes-Currently Present Thoughts of Harm to Others: Yes-Currently Present Comment - Thoughts of Harm to Others: pt made threats to other residents today Current Homicidal Intent: No Current Homicidal Plan: No Access to Homicidal Means: No Identified Victim: other nursing home residents History of harm to others?: Yes Adult nurse) Assessment of Violence: In distant past Does patient have access to weapons?: No Criminal Charges Pending?: No Does patient have a court date: No  Abuse: Abuse/Neglect Assessment (Assessment to be complete while patient is alone) Physical Abuse: Denies Verbal Abuse: Denies Sexual Abuse: Denies Exploitation of patient/patient's resources: Denies Self-Neglect: Denies  Prior Inpatient Therapy: Prior Inpatient Therapy Prior Inpatient Therapy: Yes Prior Therapy Dates: 01/2012-11/2012 Prior [facility]Provider[facility] Reason for Treatment: dementia  Prior Outpatient Therapy: Prior Outpatient Therapy Prior Outpatient Therapy: No  Additional Information: Additional Information 1:1 In Past 12 Months?:  (unknown) CIRT Risk: Yes Elopement Risk: Yes Does patient have medical clearance?: Yes  Objective: Blood pressure 147/81, pulse 93, temperature 98.7 F (37.1 C), temperature source Oral, resp. rate 20, SpO2  96.00%.There is no weight on file to calculate BMI. Results for orders placed during the hospital encounter of 05/17/13 (from the past 72 hour(s))  CBC WITH DIFFERENTIAL     Status: Abnormal   Collection Time    05/17/13  4:00 PM      Result Value Range   WBC 8.9  4.0 - 10.5 K/uL   RBC 4.49  4.22 - 5.81 MIL/uL   Hemoglobin 14.3  13.0 - 17.0 g/dL   HCT 96.0  45.4 - 09.8 %   MCV 92.7  78.0 - 100.0 fL   MCH 31.8  26.0 - 34.0 pg   MCHC 34.4  30.0 - 36.0 g/dL   RDW 11.9  14.7 - 82.9 %   Platelets 179  150 - 400 K/uL   Neutrophils Relative % 85 (*) 43 - 77 %   Neutro Abs 7.6  1.7 - 7.7 K/uL   Lymphocytes Relative 9 (*) 12 - 46 %   Lymphs Abs 0.8  0.7 - 4.0 K/uL   Monocytes Relative 5  3 - 12 %   Monocytes Absolute 0.5  0.1 - 1.0 K/uL   Eosinophils Relative 1  0 - 5 %   Eosinophils Absolute 0.1  0.0 - 0.7 K/uL   Basophils Relative 0  0 - 1 %   Basophils Absolute 0.0  0.0 - 0.1 K/uL  BASIC METABOLIC PANEL     Status: Abnormal   Collection Time    05/17/13  4:00 PM      Result Value Range   Sodium 139  135 - 145 mEq/L   Potassium 3.9  3.5 - 5.1 mEq/L   Chloride 102  96 - 112 mEq/L   CO2 28  19 - 32 mEq/L   Glucose, Bld 145 (*) 70 - 99 mg/dL   BUN 17  6 - 23 mg/dL   Creatinine, Ser 5.62  0.50 - 1.35 mg/dL   Calcium 9.3  8.4 - 13.0 mg/dL   GFR calc non Af Amer 89 (*) >90 mL/min   GFR calc Af Amer >90  >90 mL/min   Comment: (NOTE)     The eGFR has been calculated using the CKD EPI equation.     This calculation has not been validated in all clinical situations.     eGFR's persistently <90 mL/min signify possible Chronic Kidney     Disease.  ETHANOL     Status: None   Collection Time    05/17/13  4:00 PM      Result Value Range   Alcohol, Ethyl (B) <11  0 - 11 mg/dL   Comment:            LOWEST DETECTABLE LIMIT FOR     SERUM ALCOHOL IS 11 mg/dL     FOR MEDICAL PURPOSES ONLY  ACETAMINOPHEN LEVEL     Status: None   Collection Time    05/17/13  4:00 PM      Result Value Range    Acetaminophen (Tylenol), Serum <15.0  10 - 30 ug/mL   Comment:            THERAPEUTIC CONCENTRATIONS VARY     SIGNIFICANTLY. A RANGE OF 10-30     ug/mL MAY BE AN EFFECTIVE     CONCENTRATION FOR MANY PATIENTS.     HOWEVER, SOME ARE BEST TREATED     AT CONCENTRATIONS OUTSIDE THIS  RANGE.     ACETAMINOPHEN CONCENTRATIONS     >150 ug/mL AT 4 HOURS AFTER     INGESTION AND >50 ug/mL AT 12     HOURS AFTER INGESTION ARE     OFTEN ASSOCIATED WITH TOXIC     REACTIONS.  SALICYLATE LEVEL     Status: Abnormal   Collection Time    05/17/13  4:00 PM      Result Value Range   Salicylate Lvl <2.0 (*) 2.8 - 20.0 mg/dL     Current Facility-Administered Medications  Medication Dose Route Frequency Provider Last Rate Last Dose  . LORazepam (ATIVAN) tablet 1 mg  1 mg Oral Q6H PRN Roxy Horseman, PA-C   1 mg at 05/17/13 1516  . risperiDONE (RISPERDAL) 1 MG/ML oral solution 0.5 mg  0.5 mg Oral TID Roxy Horseman, PA-C   0.5 mg at 05/17/13 1516   Current Outpatient Prescriptions  Medication Sig Dispense Refill  . divalproex (DEPAKOTE SPRINKLE) 125 MG capsule Take 375 mg by mouth 3 (three) times daily with meals.       . lactobacillus acidophilus & bulgar (LACTINEX) chewable tablet Chew 2 tablets by mouth 3 (three) times daily with meals.       Marland Kitchen LORazepam (ATIVAN) 1 MG tablet Take 1 mg by mouth every 6 (six) hours as needed (for agitation).      . Memantine HCl ER (NAMENDA XR) 14 MG CP24 Take 14 mg by mouth daily.      . mirtazapine (REMERON) 15 MG tablet Take 15 mg by mouth daily.       . risperiDONE (RISPERDAL) 1 MG/ML oral solution Take 0.5 mg by mouth 3 (three) times daily.      . rivastigmine (EXELON) 9.5 mg/24hr Place 1 patch onto the skin daily.        Psychiatric Specialty Exam:     Blood pressure 147/81, pulse 93, temperature 98.7 F (37.1 C), temperature source Oral, resp. rate 20, SpO2 96.00%.There is no weight on file to calculate BMI.  General Appearance: Disheveled  Eye  Solicitor::  None  Speech:  Would not speak  Volume:  Unable to assess at this time  Mood:  Anxious and Irritable  Affect:  Depressed  Thought Process:  Unable to assess at this time  Orientation:  Other:  Unable to assess at this time  Thought Content:  Unable to assess at this time  Suicidal Thoughts:    Homicidal Thoughts:    Memory:    Judgement:    Insight:  Unable to assess at this time  Psychomotor Activity:  Restlessness  Concentration:  Unable to assess at this time  Recall:  Unable to assess at this time  Akathisia:  No  Handed:  Right  AIMS (if indicated):     Assets:  Social Support  Sleep:      Face to face interview and consulted with Dr. Lucianne Muss  Treatment Plan Summary: Daily contact with patient to assess and evaluate symptoms and progress in treatment Medication management  Disposition: [facility] psych  Inpatient treatment recommended.  Start medication.  Monitor for safety and stabilization until inpatient bed found.  Assunta Found, FNP-BC 05/17/2013 5:18 PM  8:45 PM  Discussed with ER physician that patient could be given 500 mg PO of depakote times one now as his depakote level is 10. He can then start 125mg  PO BID from tomorrow morning  Nelly Rout

## 2013-05-17 NOTE — ED Notes (Signed)
Bed: ZO10 Expected date:  Expected time:  Means of arrival:  Comments: Combative, dementia, handcuffed

## 2013-05-18 DIAGNOSIS — F332 Major depressive disorder, recurrent severe without psychotic features: Secondary | ICD-10-CM

## 2013-05-18 DIAGNOSIS — F0150 Vascular dementia without behavioral disturbance: Secondary | ICD-10-CM

## 2013-05-18 LAB — URINALYSIS, ROUTINE W REFLEX MICROSCOPIC
Bilirubin Urine: NEGATIVE
Nitrite: NEGATIVE
Specific Gravity, Urine: 1.017 (ref 1.005–1.030)
pH: 7.5 (ref 5.0–8.0)

## 2013-05-18 LAB — RAPID URINE DRUG SCREEN, HOSP PERFORMED
Amphetamines: NOT DETECTED
Opiates: NOT DETECTED

## 2013-05-18 LAB — URINE MICROSCOPIC-ADD ON

## 2013-05-18 NOTE — ED Notes (Signed)
Spoke with christinia at [facility] and informed RN that if pt does not have HCPOA that pt would have to be ivc'd.

## 2013-05-18 NOTE — ED Notes (Addendum)
Charge talked with [facility] from [facility], still waiting for contact from family to get signature for placement.

## 2013-05-18 NOTE — Progress Notes (Signed)
CSW spoke with pt guardian, [name], at [phone] and discussed pt disposition. Pt guardian agreed with plan. Pt guardian will also be in touch with pt assisted living, [facility] in order to assist with pt transition back once psychiatrically stable.   Catha Gosselin, LCSW [phone]  ED CSW .05/18/2013 939am

## 2013-05-18 NOTE — Progress Notes (Signed)
Received a phone call from Phillis at [facility] who stated that pt. Has been declined. Dr. Bluford Main that he is too violent form the unit. Debbe Odea Laurena Valko,MHT

## 2013-05-18 NOTE — Progress Notes (Addendum)
CSW intiating [facility] referral. CSW obtained [facility] Auth [id number]. CSW completed verbal demographics with [facility]. Pt pending [facility] waitlist.   Pt referred to [facility] pending reivew.   Pt declined from [place] [place]. [facility], and [facility].  No beds available at [facility].   Marland KitchenCatha Gosselin, [place] [phone]  ED CSW .05/18/2013 1106am

## 2013-05-18 NOTE — Progress Notes (Signed)
Contacted the following facilities to follow up on referrals that were previously faxed:  [facility]: Per Selena Batten, waiting for list of possible discharges, TTS will call back [place]: Per Olegario Messier, beds are available, referral faxed [facility] Per Phillis, no bed availability, no alternate fax number due to pt. Confidentiality, referral refaxed  - Rodman Pickle, MHT

## 2013-05-18 NOTE — ED Notes (Signed)
Called and spoke with staff and nh and was given pt's poa name and info. Pt's poa is [facility] and phone # is [phone]

## 2013-05-18 NOTE — Consult Note (Signed)
  Psychiatric Specialty Exam: Physical Exam  ROS  Blood pressure 134/77, pulse 72, temperature 98.8 F (37.1 C), temperature source Axillary, resp. rate 20, SpO2 99.00%.There is no weight on file to calculate BMI.  General Appearance: Disheveled  Eye Solicitor::  None  Speech:  no speech  Volume:  no speech  Mood:  irritable  Affect:  Flat  Thought Process:  not talking  Orientation:  Not talking  Thought Content:  not talking  Suicidal Thoughts:  No  Homicidal Thoughts:  No  Memory:  cannot judge  Judgement:  Impaired  Insight:  Lacking  Psychomotor Activity:  bed ridden and restrained  Concentration:  Poor  Recall:  no speech  Akathisia:  Negative  Handed:  Right  AIMS (if indicated):   0  Assets:  Housing Social Support  Sleep:   adequate   Matthew [name] is restrained because he has been combative at his nursing home and in the ER.  He would not talk to [place] during the interview.  The one to one with him said the only words he had heard was "that hurts" when he was being moved.  He has been not eating and refusing meds for the past month, reportedly.He has also been telling other residents he would kill them reportedly at the home.  Here he seems to pay no attention to whatever he is asked. He is diagnosed with vascular dementia and depression.  More specifically Major depression, recurrent, severe without psychosis. He needs to be admitted to an inpatient geriatric bed when one is available.

## 2013-05-19 MED ORDER — MIRTAZAPINE 30 MG PO TABS
15.0000 mg | ORAL_TABLET | Freq: Every day | ORAL | Status: DC
  Administered 2013-05-19: 15 mg via ORAL
  Filled 2013-05-19: qty 0.5

## 2013-05-19 MED ORDER — RIVASTIGMINE 9.5 MG/24HR TD PT24
9.5000 mg | MEDICATED_PATCH | Freq: Every day | TRANSDERMAL | Status: DC
  Administered 2013-05-19: 9.5 mg via TRANSDERMAL
  Filled 2013-05-19: qty 1

## 2013-05-19 MED ORDER — MEMANTINE HCL ER 14 MG PO CP24: 14.0000 mg | ORAL_CAPSULE | Freq: Every day | ORAL | Status: DC

## 2013-05-19 MED ORDER — MEMANTINE HCL ER 7 MG PO CP24
14.0000 mg | ORAL_CAPSULE | Freq: Every morning | ORAL | Status: DC
  Administered 2013-05-19: 14 mg via ORAL
  Filled 2013-05-19: qty 2

## 2013-05-19 NOTE — ED Notes (Signed)
Pt ate 50% of his lunch.

## 2013-05-19 NOTE — Progress Notes (Signed)
Agree with NP notes. Would recommend to discuss with family about the black box warning of risperdal in pts with dementia.

## 2013-05-19 NOTE — Progress Notes (Addendum)
CSW contacted [facility] [phone]) to inform them that pt is psychiatrically and medically cleared to return. IVC rescinded by MD Denton Lank. Per Delorise Jackson, facility will be ready to receive pt. CSW left message for pt guardian, [name], to inform him of pt disposition. EDP and RN aware.  [place] Spaniel [place], [phone]     ED CSW  1:07 pm

## 2013-05-19 NOTE — ED Notes (Signed)
He is resting quietly.  I have received word from our social worker that pt. Will be returning to nursing home.  I have also spoken with Dr. Denton Lank, and he tells me he will enter his d/c shortly.

## 2013-05-19 NOTE — ED Provider Notes (Addendum)
Pt resting quietly, nad, vitals normal.  On review, pt appears to have dementia w behavioral issues associated with dementia. It appears these behaviors have been noted x multiple months.   It seems best plan for patient would be for psych team to adjust meds, and social work to facilitate return to his current ECF - will discuss w psych team and social work.     Suzi Roots, MD 05/19/13 [phone]   Discussed w psych team - no additional new recommendations, agree w restarting on pts normal meds incl risperdal (which pt has already been on in ed).  Pt remains alert, content.   Pt has not harmed ED staff. Expresses no thoughts about harm to self/SI, or others. At times during ED stay pt w periods agitation/behavioral issues, but today/currently, calm, cooperative.  Discussed w sw, will facilitate transfer back to his ECF.  Based on review of notes going back several months - given pts dementia, and periodic behavioral issues/agitation, pt may benefit from close pcp follow up and consideration of periodic geropsych follow up as outpt to reassess, reassess meds, etc.         Suzi Roots, MD 05/19/13 1258

## 2013-05-19 NOTE — ED Notes (Signed)
Pt ate 75% of his breakfast.

## 2013-05-19 NOTE — Progress Notes (Signed)
Patient ID: Matthew [name], male   DOB: [birth year], 62 y.o.   MRN: [id number] Patient Identification:  Matthew [name] Date of Evaluation:  05/19/2013   History of Present Illness:  Brought in from Nursing home where patient have not been taking his medications or eating.  Patient has a history of Dementia and Depression.   He is no longer on restraint  and combative while awake but manageable.  He will be going back to his nursing home, Psychiatry suggest Risperdal 0.25 mg po BID.  His behavior may be due to his not taking his medications.  Patient will be discharged by EDP to follow up with his PMD.  Past Psychiatric History: Depression, Dementia   Past Medical History:     Past Medical History  Diagnosis Date  . Dementia   . Dementia   . Osteoporosis       History reviewed. No pertinent past surgical history.  Allergies: No Known Allergies  Current Medications:  Prior to Admission medications   Medication Sig Start Date End Date Taking? Authorizing Provider  divalproex (DEPAKOTE SPRINKLE) 125 MG capsule Take 375 mg by mouth 3 (three) times daily with meals.     Historical Provider, MD  lactobacillus acidophilus & bulgar (LACTINEX) chewable tablet Chew 2 tablets by mouth 3 (three) times daily with meals.     Historical Provider, MD  LORazepam (ATIVAN) 1 MG tablet Take 1 mg by mouth every 6 (six) hours as needed (for agitation).    Historical Provider, MD  Memantine HCl ER (NAMENDA XR) 14 MG CP24 Take 14 mg by mouth daily.    Historical Provider, MD  mirtazapine (REMERON) 15 MG tablet Take 15 mg by mouth daily.     Historical Provider, MD  risperiDONE (RISPERDAL) 1 MG/ML oral solution Take 0.5 mg by mouth 3 (three) times daily.    Historical Provider, MD  rivastigmine (EXELON) 9.5 mg/24hr Place 1 patch onto the skin daily.    Historical Provider, MD    Social History:    has no tobacco, alcohol, and drug history on file.   Family History:    History reviewed. No pertinent family  history.  Mental Status Examination/Evaluation:Psychiatric Specialty Exam: Physical Exam  ROS  Blood pressure 109/66, pulse 81, temperature 98.4 F (36.9 C), temperature source Axillary, resp. rate 18, SpO2 96.00%.There is no weight on file to calculate BMI.  General Appearance: Casual and Fairly Groomed  Eye Contact::  Absent  Speech:  sleeping, medicated  Volume:  sleeping  Mood:  Irritable and sleeping at time of visist, combative as reported Counsellor.  Affect:  Depressed, Flat and combative while awake  Thought Process:  Disorganized and sleeping on assessment, combative when awake  Orientation:  Other:  unable to assess, patient sleeping, hx of dementia  Thought Content:  unable to assess , sleeping, hx of dementia  Suicidal Thoughts:  No  Homicidal Thoughts:  No  Memory:  unable to assess secondary to dementia, patient sleeping   Judgement:  Other:  unable to assess, hx Dementia, sleeping.  Insight:  Lacking and hx dementia  Psychomotor Activity:  Increased, Restlessness and combative while awake  Concentration:  Poor  Recall:  Poor  Akathisia:  NA  Handed:  Right  AIMS (if indicated):     Assets:  Desire for Improvement  Sleep:          DIAGNOSIS:   AXIS I   Dementia  AXIS II  Deffered  AXIS III See medical notes.  AXIS IV  other psychosocial or environmental problems and problems related to social environment  AXIS V 51-60 moderate symptoms     Assessment/Plan: Consult and face to face assessment with Dr Tawni Carnes Patient will be discharged by EDP who believe patient's behavior is due to his not taking medications Psychiatry suggest 0.25 mg Risperdal BID to manage his behavior  Dahlia Byes   PMHNP-BC

## 2013-05-19 NOTE — ED Notes (Signed)
PTAR is here to tx him to [facility].  He remains confused, and in no distress.

## 2013-05-19 NOTE — ED Notes (Signed)
Patient was change to Hospital bed. Pt is trying to get out of bed throughout the day, confused and combative.

## 2013-05-21 NOTE — ED Provider Notes (Signed)
Medical screening examination/treatment/procedure(s) were performed by non-physician practitioner and as supervising physician I was immediately available for consultation/collaboration.  EKG Interpretation     Ventricular Rate:  72 PR Interval:  151 QRS Duration: 70 QT Interval:  387 QTC Calculation: 423 R Axis:   67 Text Interpretation:  Sinus rhythm Abnormal R-wave progression, early transition Baseline wander in lead(s) I III aVL No significant change since last tracing              Gwyneth Sprout, MD 05/21/13 [phone]

## 2014-02-07 ENCOUNTER — Encounter (HOSPITAL_COMMUNITY): Payer: Self-pay | Admitting: Emergency Medicine

## 2014-02-07 ENCOUNTER — Emergency Department (HOSPITAL_COMMUNITY): Payer: Medicare Other

## 2014-02-07 ENCOUNTER — Emergency Department (HOSPITAL_COMMUNITY)
Admission: EM | Admit: 2014-02-07 | Discharge: 2014-02-07 | Disposition: A | Discharge status: Home / Self Care | Payer: Medicare Other | Attending: Emergency Medicine | Admitting: Emergency Medicine

## 2014-02-07 DIAGNOSIS — Z8739 Personal history of other diseases of the musculoskeletal system and connective tissue: Secondary | ICD-10-CM | POA: Diagnosis not present

## 2014-02-07 DIAGNOSIS — R569 Unspecified convulsions: Secondary | ICD-10-CM | POA: Insufficient documentation

## 2014-02-07 DIAGNOSIS — F0390 Unspecified dementia without behavioral disturbance: Secondary | ICD-10-CM | POA: Insufficient documentation

## 2014-02-07 DIAGNOSIS — Z79899 Other long term (current) drug therapy: Secondary | ICD-10-CM | POA: Insufficient documentation

## 2014-02-07 LAB — URINALYSIS, ROUTINE W REFLEX MICROSCOPIC
BILIRUBIN URINE: NEGATIVE
Glucose, UA: NEGATIVE mg/dL
KETONES UR: NEGATIVE mg/dL
Leukocytes, UA: NEGATIVE
NITRITE: NEGATIVE
Protein, ur: NEGATIVE mg/dL
Specific Gravity, Urine: 1.011 (ref 1.005–1.030)
Urobilinogen, UA: 1 mg/dL (ref 0.0–1.0)
pH: 8.5 — ABNORMAL HIGH (ref 5.0–8.0)

## 2014-02-07 LAB — BASIC METABOLIC PANEL
Anion gap: 14 (ref 5–15)
BUN: 19 mg/dL (ref 6–23)
CALCIUM: 9.4 mg/dL (ref 8.4–10.5)
CO2: 27 meq/L (ref 19–32)
CREATININE: 0.94 mg/dL (ref 0.50–1.35)
Chloride: 104 mEq/L (ref 96–112)
GFR calc Af Amer: >90 mL/min (ref >90)
GFR calc non Af Amer: 86 mL/min — ABNORMAL LOW (ref >90)
Glucose, Bld: 104 mg/dL — ABNORMAL HIGH (ref 70–99)
Potassium: 4.3 mEq/L (ref 3.7–5.3)
Sodium: 145 mEq/L (ref 137–147)

## 2014-02-07 LAB — CBC WITH DIFFERENTIAL/PLATELET
BASOS ABS: 0 10*3/uL (ref 0.0–0.1)
Basophils Relative: 0 % (ref 0–1)
EOS PCT: 2 % (ref 0–5)
Eosinophils Absolute: 0.1 10*3/uL (ref 0.0–0.7)
HEMATOCRIT: 43.6 % (ref 39.0–52.0)
Hemoglobin: 14.6 g/dL (ref 13.0–17.0)
LYMPHS ABS: 1.5 10*3/uL (ref 0.7–4.0)
LYMPHS PCT: 23 % (ref 12–46)
MCH: 31.4 pg (ref 26.0–34.0)
MCHC: 33.5 g/dL (ref 30.0–36.0)
MCV: 93.8 fL (ref 78.0–100.0)
MONO ABS: 0.5 10*3/uL (ref 0.1–1.0)
Monocytes Relative: 8 % (ref 3–12)
Neutro Abs: 4.4 10*3/uL (ref 1.7–7.7)
Neutrophils Relative %: 67 % (ref 43–77)
Platelets: 149 10*3/uL — ABNORMAL LOW (ref 150–400)
RBC: 4.65 MIL/uL (ref 4.22–5.81)
RDW: 12.2 % (ref 11.5–15.5)
WBC: 6.6 10*3/uL (ref 4.0–10.5)

## 2014-02-07 LAB — URINE MICROSCOPIC-ADD ON

## 2014-02-07 LAB — I-STAT CG4 LACTIC ACID, ED: Lactic Acid, Venous: 4.74 mmol/L — ABNORMAL HIGH (ref 0.5–2.2)

## 2014-02-07 LAB — VALPROIC ACID LEVEL: Valproic Acid Lvl: 30.3 ug/mL — ABNORMAL LOW (ref 50.0–100.0)

## 2014-02-07 MED ORDER — LORAZEPAM 2 MG/ML IJ SOLN
1.0000 mg | Freq: Once | INTRAMUSCULAR | Status: AC
  Administered 2014-02-07: 1 mg via INTRAMUSCULAR
  Filled 2014-02-07: qty 1

## 2014-02-07 MED ORDER — DIVALPROEX SODIUM 125 MG PO CPSP
375.0000 mg | ORAL_CAPSULE | Freq: Once | ORAL | Status: AC
  Administered 2014-02-07: 375 mg via ORAL
  Filled 2014-02-07: qty 3

## 2014-02-07 NOTE — ED Provider Notes (Signed)
CSN: [id number]     Arrival date & time 02/07/14  0443 History   First MD Initiated Contact with Patient 02/07/14 0501     Chief Complaint  Patient presents with  . Seizures     (Consider location/radiation/quality/duration/timing/severity/associated sxs/prior Treatment) HPI [age] year old male presents emergency department via EMS from his nursing facility after reported seizure.  It is reported that patient was walking the halls, and became incontinent onto the floor.  Patient then had a 4 minute seizure.  It is unclear if the patient has a history of seizures, but he is on Depakote.  Patient is minimally verbal at baseline.  EMS reports per nursing home staff patient is back to his baseline when they arrived.  Patient can say yes or no, but it is unclear he understands the questions. Past Medical History  Diagnosis Date  . Dementia   . Dementia   . Osteoporosis    History reviewed. No pertinent past surgical history. No family history on file. History  Substance Use Topics  . Smoking status: Unknown If Ever Smoked  . Smokeless tobacco: Not on file  . Alcohol Use: No    Review of Systems  Unable to perform ROS: Dementia      Allergies  Review of patient's allergies indicates no known allergies.  Home Medications   Prior to Admission medications   Medication Sig Start Date End Date Taking? Authorizing Provider  divalproex (DEPAKOTE SPRINKLE) 125 MG capsule Take 375 mg by mouth 3 (three) times daily with meals.    Yes Historical Provider, MD  lactobacillus acidophilus & bulgar (LACTINEX) chewable tablet Chew 2 tablets by mouth 3 (three) times daily with meals.    Yes Historical Provider, MD  Memantine HCl ER (NAMENDA XR) 14 MG CP24 Take 14 mg by mouth daily.   Yes Historical Provider, MD  mirtazapine (REMERON) 15 MG tablet Take 15 mg by mouth daily.    Yes Historical Provider, MD  risperiDONE (RISPERDAL) 1 MG/ML oral solution Take 0.5 mg by mouth 3 (three) times daily.   Yes  Historical Provider, MD  rivastigmine (EXELON) 9.5 mg/24hr Place 1 patch onto the skin daily.   Yes Historical Provider, MD  terbinafine (LAMISIL) 1 % cream Apply 1 application topically 2 (two) times daily.   Yes Historical Provider, MD   BP 146/68  Pulse 71  Temp(Src) 97.8 F (36.6 C) (Axillary)  Resp 18  SpO2 99% Physical Exam  Nursing note and vitals reviewed. Constitutional: He appears well-developed and well-nourished. No distress.  HENT:  Head: Normocephalic and atraumatic.  Right Ear: External ear normal.  Left Ear: External ear normal.  Nose: Nose normal.  Mouth/Throat: Oropharynx is clear and moist.  Poor oral and dental hygiene  Eyes: Conjunctivae and EOM are normal. Pupils are equal, round, and reactive to light.  Neck: Normal range of motion. Neck supple. No JVD present. No tracheal deviation present. No thyromegaly present.  Cardiovascular: Normal rate, regular rhythm, normal heart sounds and intact distal pulses.  Exam reveals no gallop and no friction rub.   No murmur heard. Pulmonary/Chest: Effort normal and breath sounds normal. No stridor. No respiratory distress. He has no wheezes. He has no rales. He exhibits no tenderness.  Abdominal: Soft. Bowel sounds are normal. He exhibits no distension and no mass. There is no tenderness. There is no rebound and no guarding.  Musculoskeletal: Normal range of motion. He exhibits no edema and no tenderness.  Lymphadenopathy:    He has no cervical adenopathy.  Neurological: He is alert. No cranial nerve deficit. He exhibits normal muscle tone. Coordination normal.  Skin: Skin is warm and dry. No rash noted. No erythema. No pallor.  Psychiatric:  Unable to assess    ED Course  Procedures (including critical care time) Labs Review Labs Reviewed  CBC WITH DIFFERENTIAL - Abnormal; Notable for the following:    Platelets 149 (*)    All other components within normal limits  I-STAT CG4 LACTIC ACID, ED - Abnormal; Notable  for the following:    Lactic Acid, Venous 4.74 (*)    All other components within normal limits  URINE CULTURE  BASIC METABOLIC PANEL  VALPROIC ACID LEVEL  URINALYSIS, ROUTINE W REFLEX MICROSCOPIC    Imaging Review No results found.   EKG Interpretation None      MDM   Final diagnoses:  None    63 year old male with early dementia, possible seizure disorder with seizure tonight.  Plan for labs, head and C-spine CT scans given his fall from standing.  Expect patient will be able to return to his nursing facility.  Care passed to Dr Loretha Stapler awaiting results of CT scan, urine, and depakote level.  Olivia Mackie, MD 02/07/14 [phone]

## 2014-02-07 NOTE — Discharge Instructions (Signed)

## 2014-02-07 NOTE — ED Provider Notes (Signed)
Care assumed from Dr. Norlene Campbelltter.  63 yo male with presumed seizure disorder, on depakote, presenting after a seizure.  Workup unremarkable.  Lactic acid elevated likely secondary to seizure, as he is well appearing with stable vitals.  Valproic acid level a little low, have ordered his AM dose.  Plan dc back to facility.  Clinical Impression: 1. Seizure       Matthew ChurnJohn Quinn Beau Ramsburg III, MD 02/07/14 1034

## 2014-02-07 NOTE — ED Notes (Signed)
Notified EDP, Otter,MD. Pt. i-stat CG4 lactic acid 4.74.

## 2014-02-07 NOTE — ED Notes (Signed)
Per EMS report: [facility]: Staff reports pt got up and walked down the hallway and urinated on floor.  Pt then continue down the hallway and fell down and began to have a seizure.  Staff reports that he began to have seizure at 04:06 -04:10.  Pt then went to sleep.  EMS reports pt has PERRLA, no incontinence or tongue injury noted.  Pt is nonverbal at his baseline, which pt is on arrival to ED.

## 2014-02-07 NOTE — ED Notes (Signed)
Bed: [id number] Expected date: 02/07/14 Expected time: 4:35 AM Means of arrival: Ambulance Comments: 63 yo M  Seizure

## 2014-02-09 LAB — URINE CULTURE: Colony Count: 30000

## 2014-05-29 ENCOUNTER — Emergency Department (HOSPITAL_COMMUNITY)
Admission: EM | Admit: 2014-05-29 | Discharge: 2014-05-29 | Disposition: A | Discharge status: Home / Self Care | Payer: Medicare Other | Attending: Emergency Medicine | Admitting: Emergency Medicine

## 2014-05-29 ENCOUNTER — Encounter (HOSPITAL_COMMUNITY): Payer: Self-pay | Admitting: *Deleted

## 2014-05-29 DIAGNOSIS — R569 Unspecified convulsions: Secondary | ICD-10-CM

## 2014-05-29 DIAGNOSIS — F0390 Unspecified dementia without behavioral disturbance: Secondary | ICD-10-CM | POA: Diagnosis not present

## 2014-05-29 DIAGNOSIS — Z8739 Personal history of other diseases of the musculoskeletal system and connective tissue: Secondary | ICD-10-CM | POA: Diagnosis not present

## 2014-05-29 DIAGNOSIS — Z79899 Other long term (current) drug therapy: Secondary | ICD-10-CM | POA: Diagnosis not present

## 2014-05-29 DIAGNOSIS — G40909 Epilepsy, unspecified, not intractable, without status epilepticus: Secondary | ICD-10-CM | POA: Diagnosis not present

## 2014-05-29 HISTORY — DX: Unspecified convulsions: R56.9

## 2014-05-29 LAB — CBC WITH DIFFERENTIAL/PLATELET
BASOS PCT: 0 % (ref 0–1)
Basophils Absolute: 0 10*3/uL (ref 0.0–0.1)
EOS PCT: 1 % (ref 0–5)
Eosinophils Absolute: 0.1 10*3/uL (ref 0.0–0.7)
HCT: 42.2 % (ref 39.0–52.0)
HEMOGLOBIN: 14.2 g/dL (ref 13.0–17.0)
LYMPHS PCT: 10 % — AB (ref 12–46)
Lymphs Abs: 0.7 10*3/uL (ref 0.7–4.0)
MCH: 31.7 pg (ref 26.0–34.0)
MCHC: 33.6 g/dL (ref 30.0–36.0)
MCV: 94.2 fL (ref 78.0–100.0)
MONO ABS: 0.6 10*3/uL (ref 0.1–1.0)
Monocytes Relative: 9 % (ref 3–12)
Neutro Abs: 5.1 10*3/uL (ref 1.7–7.7)
Neutrophils Relative %: 80 % — ABNORMAL HIGH (ref 43–77)
Platelets: 147 10*3/uL — ABNORMAL LOW (ref 150–400)
RBC: 4.48 MIL/uL (ref 4.22–5.81)
RDW: 12.1 % (ref 11.5–15.5)
WBC: 6.5 10*3/uL (ref 4.0–10.5)

## 2014-05-29 LAB — BASIC METABOLIC PANEL
ANION GAP: 11 (ref 5–15)
BUN: 14 mg/dL (ref 6–23)
CALCIUM: 9.5 mg/dL (ref 8.4–10.5)
CO2: 29 meq/L (ref 19–32)
CREATININE: 0.97 mg/dL (ref 0.50–1.35)
Chloride: 105 mEq/L (ref 96–112)
GFR calc Af Amer: >90 mL/min (ref >90)
GFR calc non Af Amer: 85 mL/min — ABNORMAL LOW (ref >90)
Glucose, Bld: 95 mg/dL (ref 70–99)
Potassium: 4.6 mEq/L (ref 3.7–5.3)
Sodium: 145 mEq/L (ref 137–147)

## 2014-05-29 LAB — VALPROIC ACID LEVEL: Valproic Acid Lvl: 36.7 ug/mL — ABNORMAL LOW (ref 50.0–100.0)

## 2014-05-29 MED ORDER — DIVALPROEX SODIUM 125 MG PO CPSP
625.0000 mg | ORAL_CAPSULE | Freq: Once | ORAL | Status: AC
  Administered 2014-05-29: 625 mg via ORAL
  Filled 2014-05-29: qty 5

## 2014-05-29 MED ORDER — DIVALPROEX SODIUM 125 MG PO CPSP
600.0000 mg | ORAL_CAPSULE | Freq: Once | ORAL | Status: DC
  Filled 2014-05-29: qty 5

## 2014-05-29 NOTE — ED Notes (Signed)
Pt got out of bed and was found walking in hallway, pt taken back to bed, pt will not keep monitor on.

## 2014-05-29 NOTE — ED Notes (Signed)
Bed: WA06 Expected date: 05/29/14 Expected time: 5:37 AM Means of arrival: Ambulance Comments: seizures

## 2014-05-29 NOTE — Discharge Instructions (Signed)

## 2014-05-29 NOTE — ED Notes (Signed)
Pt refusing to take PO medication.  Sprinkled depakote dose in apple sauce and pt refuses by saying no and pushing RN's away. Will nofity PA.

## 2014-05-29 NOTE — ED Notes (Signed)
Per EMS [facility], around 5 am pt had a seizure lasting 5 minutes per staff their, staff stated it was witnessed, pt was standing and lowered to ground, pt is post-ictal, 22G R hand, CBG 126, BP 140/90, sinus 84, 98% RA.

## 2014-05-29 NOTE — ED Notes (Signed)
Pt is sitting up in bed in a gaze, does not want to be touched to get into gown, pt does not talk when talked to.

## 2014-05-29 NOTE — ED Provider Notes (Signed)
CSN: [id number]     Arrival date & time 05/29/14  0547 History   First MD Initiated Contact with Patient 05/29/14 0600     Chief Complaint  Patient presents with  . Seizures     (Consider location/radiation/quality/duration/timing/severity/associated sxs/prior Treatment) HPI Comments: Patient with a history of Dementia and Seizure Disorder presents [facility] after a seizure.  Seizure was witnessed by the staff.  Staff report that it lasted for 5 minutes.  The patient was lowered to the ground and did not fall. He is currently on Depakote for Seizure Disorder.  Medication log from the Nursing Home shows that his last dose of Depakote was last evening.  He has not missed any doses.  He is minimally verbal at baseline.   Patient is able to respond with yes or no, but is unable to provide further history.    History provided by: EMS.    Past Medical History  Diagnosis Date  . Dementia   . Dementia   . Osteoporosis   . Seizures    History reviewed. No pertinent past surgical history. No family history on file. History  Substance Use Topics  . Smoking status: Unknown If Ever Smoked  . Smokeless tobacco: Not on file  . Alcohol Use: No    Review of Systems  Unable to perform ROS: Dementia      Allergies  Review of patient's allergies indicates no known allergies.  Home Medications   Prior to Admission medications   Medication Sig Start Date End Date Taking? Authorizing Provider  divalproex (DEPAKOTE SPRINKLE) 125 MG capsule Take 375 mg by mouth 3 (three) times daily with meals.     Historical Provider, MD  lactobacillus acidophilus & bulgar (LACTINEX) chewable tablet Chew 2 tablets by mouth 3 (three) times daily with meals.     Historical Provider, MD  Memantine HCl ER (NAMENDA XR) 14 MG CP24 Take 14 mg by mouth daily.    Historical Provider, MD  mirtazapine (REMERON) 15 MG tablet Take 15 mg by mouth daily.     Historical Provider, MD   risperiDONE (RISPERDAL) 1 MG/ML oral solution Take 0.5 mg by mouth 3 (three) times daily.    Historical Provider, MD  rivastigmine (EXELON) 9.5 mg/24hr Place 1 patch onto the skin daily.    Historical Provider, MD  terbinafine (LAMISIL) 1 % cream Apply 1 application topically 2 (two) times daily.    Historical Provider, MD   BP 134/76 mmHg  Pulse 71  Temp(Src) 98.1 F (36.7 C) (Oral)  Resp 18  SpO2 97% Physical Exam  Constitutional: He appears well-developed and well-nourished.  HENT:  Head: Normocephalic and atraumatic.  Mouth/Throat: Oropharynx is clear and moist.  No signs of trauma to the tongue  Eyes: EOM are normal. Pupils are equal, round, and reactive to light.  Neck: Normal range of motion. Neck supple.  Cardiovascular: Normal rate, regular rhythm and normal heart sounds.   Pulmonary/Chest: Effort normal and breath sounds normal. No respiratory distress. He has no wheezes. He has no rales.  Abdominal: Soft. Bowel sounds are normal. There is no tenderness.  Musculoskeletal: Normal range of motion.  Neurological: He is alert. He has normal strength. Gait normal.  Skin: Skin is warm and dry. No rash noted.  Psychiatric: He has a normal mood and affect.  Nursing note and vitals reviewed.   ED Course  Procedures (including critical care time) Labs Review Labs Reviewed - No data to display  Imaging Review No  results found.   EKG Interpretation None     Talked with the staff at [facility].  They report that the patient does not speak at baseline aside from yes and no.  They report that the patient does not like to sit still and frequently walks around the facility.   MDM   Final diagnoses:  None   Patient with a history of Seizure Disorder currently on Depakote presents today after a seizure.  Seizure was witnessed by the staff.  Patient lowered to the ground by staff and did not hit his head or fall.  Patient minimally verbal in the ED, but staff at Nursing  Home report that this is baseline.  Patient ambulating in the ED without difficulty.  Patient afebrile.  Labs today unremarkable.  Depakote level slightly low.  Patient given 625 mg of Depakote sprinkles in the ED.  Feel that the patient is stable for discharge back to Nursing Home.  Return precautions discussed with staff.    Santiago GladHeather Tymeir Weathington, PA-C 05/29/14 2104  Olivia Mackielga M Otter, MD 05/29/14 2106

## 2014-08-05 ENCOUNTER — Emergency Department (HOSPITAL_COMMUNITY): Payer: PPO

## 2014-08-05 ENCOUNTER — Encounter (HOSPITAL_COMMUNITY): Payer: Self-pay | Admitting: Emergency Medicine

## 2014-08-05 ENCOUNTER — Emergency Department (HOSPITAL_COMMUNITY)
Admission: EM | Admit: 2014-08-05 | Discharge: 2014-08-05 | Disposition: A | Discharge status: Home / Self Care | Payer: PPO | Attending: Emergency Medicine | Admitting: Emergency Medicine

## 2014-08-05 DIAGNOSIS — Y9389 Activity, other specified: Secondary | ICD-10-CM | POA: Insufficient documentation

## 2014-08-05 DIAGNOSIS — G40909 Epilepsy, unspecified, not intractable, without status epilepticus: Secondary | ICD-10-CM | POA: Diagnosis present

## 2014-08-05 DIAGNOSIS — S51012A Laceration without foreign body of left elbow, initial encounter: Secondary | ICD-10-CM | POA: Insufficient documentation

## 2014-08-05 DIAGNOSIS — Y998 Other external cause status: Secondary | ICD-10-CM | POA: Diagnosis not present

## 2014-08-05 DIAGNOSIS — Z23 Encounter for immunization: Secondary | ICD-10-CM | POA: Diagnosis not present

## 2014-08-05 DIAGNOSIS — Y92129 Unspecified place in nursing home as the place of occurrence of the external cause: Secondary | ICD-10-CM | POA: Diagnosis not present

## 2014-08-05 DIAGNOSIS — R569 Unspecified convulsions: Secondary | ICD-10-CM

## 2014-08-05 DIAGNOSIS — W1839XA Other fall on same level, initial encounter: Secondary | ICD-10-CM | POA: Diagnosis not present

## 2014-08-05 DIAGNOSIS — M810 Age-related osteoporosis without current pathological fracture: Secondary | ICD-10-CM | POA: Diagnosis not present

## 2014-08-05 DIAGNOSIS — F0390 Unspecified dementia without behavioral disturbance: Secondary | ICD-10-CM | POA: Diagnosis not present

## 2014-08-05 DIAGNOSIS — M25522 Pain in left elbow: Secondary | ICD-10-CM

## 2014-08-05 DIAGNOSIS — Z79899 Other long term (current) drug therapy: Secondary | ICD-10-CM | POA: Insufficient documentation

## 2014-08-05 DIAGNOSIS — S0003XA Contusion of scalp, initial encounter: Secondary | ICD-10-CM | POA: Insufficient documentation

## 2014-08-05 LAB — CBC
HCT: 42.5 % (ref 39.0–52.0)
Hemoglobin: 14.8 g/dL (ref 13.0–17.0)
MCH: 32.8 pg (ref 26.0–34.0)
MCHC: 34.8 g/dL (ref 30.0–36.0)
MCV: 94.2 fL (ref 78.0–100.0)
Platelets: 165 10*3/uL (ref 150–400)
RBC: 4.51 MIL/uL (ref 4.22–5.81)
RDW: 12.3 % (ref 11.5–15.5)
WBC: 8.4 10*3/uL (ref 4.0–10.5)

## 2014-08-05 LAB — URINALYSIS, ROUTINE W REFLEX MICROSCOPIC
BILIRUBIN URINE: NEGATIVE
Glucose, UA: NEGATIVE mg/dL
Ketones, ur: NEGATIVE mg/dL
Leukocytes, UA: NEGATIVE
Nitrite: NEGATIVE
Protein, ur: NEGATIVE mg/dL
SPECIFIC GRAVITY, URINE: 1.009 (ref 1.005–1.030)
Urobilinogen, UA: 0.2 mg/dL (ref 0.0–1.0)
pH: 8.5 — ABNORMAL HIGH (ref 5.0–8.0)

## 2014-08-05 LAB — BASIC METABOLIC PANEL
ANION GAP: 4 — AB (ref 5–15)
BUN: 10 mg/dL (ref 6–23)
CALCIUM: 9 mg/dL (ref 8.4–10.5)
CO2: 32 mmol/L (ref 19–32)
Chloride: 102 mEq/L (ref 96–112)
Creatinine, Ser: 0.93 mg/dL (ref 0.50–1.35)
GFR calc Af Amer: >90 mL/min (ref >90)
GFR calc non Af Amer: 86 mL/min — ABNORMAL LOW (ref >90)
Glucose, Bld: 112 mg/dL — ABNORMAL HIGH (ref 70–99)
Potassium: 4.2 mmol/L (ref 3.5–5.1)
Sodium: 138 mmol/L (ref 135–145)

## 2014-08-05 LAB — URINE MICROSCOPIC-ADD ON

## 2014-08-05 LAB — VALPROIC ACID LEVEL: Valproic Acid Lvl: 27.1 ug/mL — ABNORMAL LOW (ref 50.0–100.0)

## 2014-08-05 MED ORDER — LEVOFLOXACIN IN D5W 750 MG/150ML IV SOLN
750.0000 mg | Freq: Once | INTRAVENOUS | Status: AC
  Administered 2014-08-05: 750 mg via INTRAVENOUS
  Filled 2014-08-05: qty 150

## 2014-08-05 MED ORDER — LORAZEPAM 2 MG/ML IJ SOLN
1.0000 mg | Freq: Once | INTRAMUSCULAR | Status: AC
  Administered 2014-08-05: 1 mg via INTRAVENOUS
  Filled 2014-08-05: qty 1

## 2014-08-05 MED ORDER — VALPROATE SODIUM 500 MG/5ML IV SOLN
500.0000 mg | Freq: Once | INTRAVENOUS | Status: AC
  Administered 2014-08-05: 500 mg via INTRAVENOUS
  Filled 2014-08-05: qty 5

## 2014-08-05 MED ORDER — LEVOFLOXACIN 750 MG PO TABS: 750.0000 mg | ORAL_TABLET | Freq: Every day | ORAL | Status: DC

## 2014-08-05 MED ORDER — TETANUS-DIPHTH-ACELL PERTUSSIS 5-2.5-18.5 LF-MCG/0.5 IM SUSP
0.5000 mL | Freq: Once | INTRAMUSCULAR | Status: AC
  Administered 2014-08-05: 0.5 mL via INTRAMUSCULAR
  Filled 2014-08-05: qty 0.5

## 2014-08-05 NOTE — ED Provider Notes (Signed)
CSN: [id number]     Arrival date & time 08/05/14  0503 History   First MD Initiated Contact with Patient 08/05/14 0507     Chief Complaint  Patient presents with  . Seizures     (Consider location/radiation/quality/duration/timing/severity/associated sxs/prior Treatment) HPI Comments: 64 year old male past medical history significant for dementia, seizures presents emergency department via EMS from his nursing facility after reported seizure that lasted approximately 30 minutes.Patient is on Depakote, unaware if missed doses or not. Patient is minimally verbal at baseline.Per EMS patient back to baseline. Patient is a level V caveat.   Patient is a 64 y.o. male presenting with seizures.  Seizures   Past Medical History  Diagnosis Date  . Dementia   . Dementia   . Osteoporosis   . Seizures    History reviewed. No pertinent past surgical history. No family history on file. History  Substance Use Topics  . Smoking status: Unknown If Ever Smoked  . Smokeless tobacco: Not on file  . Alcohol Use: No    Review of Systems  Unable to perform ROS: Dementia  Neurological: Positive for seizures.      Allergies  Review of patient's allergies indicates no known allergies.  Home Medications   Prior to Admission medications   Medication Sig Start Date End Date Taking? Authorizing Provider  acetaminophen (TYLENOL) 500 MG tablet Take 500 mg by mouth every 6 (six) hours as needed for mild pain.    Historical Provider, MD  alum & mag hydroxide-simeth (MAALOX/MYLANTA) 200-200-20 MG/5ML suspension Take 30 mLs by mouth every 6 (six) hours as needed for indigestion or heartburn.    Historical Provider, MD  divalproex (DEPAKOTE SPRINKLE) 125 MG capsule Take 375 mg by mouth 3 (three) times daily with meals.     Historical Provider, MD  lactobacillus acidophilus & bulgar (LACTINEX) chewable tablet Chew 2 tablets by mouth 3 (three) times daily with meals.     Historical Provider, MD    loperamide (IMODIUM) 2 MG capsule Take 2 mg by mouth as needed for diarrhea or loose stools.    Historical Provider, MD  magnesium hydroxide (MILK OF MAGNESIA) 400 MG/5ML suspension Take 30 mLs by mouth at bedtime as needed for mild constipation.    Historical Provider, MD  Memantine HCl ER (NAMENDA XR) 14 MG CP24 Take 14 mg by mouth daily.    Historical Provider, MD  mirtazapine (REMERON) 15 MG tablet Take 15 mg by mouth daily.     Historical Provider, MD  neomycin-bacitracin-polymyxin (NEOSPORIN) ointment Apply 1 application topically as needed for wound care. apply to eye    Historical Provider, MD  risperiDONE (RISPERDAL) 1 MG/ML oral solution Take 0.5 mg by mouth 3 (three) times daily.    Historical Provider, MD  rivastigmine (EXELON) 9.5 mg/24hr Place 1 patch onto the skin daily.    Historical Provider, MD  terbinafine (LAMISIL) 1 % cream Apply 1 application topically 2 (two) times daily.    Historical Provider, MD  Vitamin D, Ergocalciferol, (DRISDOL) 50000 UNITS CAPS capsule Take 50,000 Units by mouth every 7 (seven) days. Wednesdays    Historical Provider, MD   BP 154/87 mmHg  Pulse 81  Resp 17  SpO2 98% Physical Exam  Constitutional: He appears well-developed and well-nourished. No distress.  HENT:  Head: Normocephalic.  Right Ear: External ear normal.  Left Ear: External ear normal.  Nose: Nose normal.  Mouth/Throat: No oropharyngeal exudate.  Posterior scalp hematoma.   Eyes: Conjunctivae and EOM are normal. Pupils are equal,  round, and reactive to light.  Neck: Normal range of motion. Neck supple.  Cardiovascular: Normal rate, regular rhythm and normal heart sounds.   Pulmonary/Chest: Effort normal and breath sounds normal. No respiratory distress.  Abdominal: Soft. There is no tenderness.  Musculoskeletal:  Moves all extremities.  Neurological: He is alert.  Skin: He is not diaphoretic.     Nursing note and vitals reviewed.   ED Course  Procedures (including  critical care time) Medications  Tdap (BOOSTRIX) injection 0.5 mL (not administered)    Labs Review Labs Reviewed  VALPROIC ACID LEVEL  URINALYSIS, ROUTINE W REFLEX MICROSCOPIC  CBC  BASIC METABOLIC PANEL    Imaging Review No results found.   EKG Interpretation None      MDM   Final diagnoses:  Seizure  Left elbow pain    Filed Vitals:   08/05/14 0514  BP: 154/87  Pulse: 81  Resp: 17   Will obtain CT head and neck for evaluation of fall. Will obtain DG left elbow and CXR. Labs ordered. UA ordered. Depakote level ordered. Will ensure no infectious cause of decreased seizure threshold. Signed out to [employer]. Patient d/w with Dr. Mora Bellman, agrees with plan.     Jeannetta Ellis, PA-C 08/05/14 0600  Vida Roller, MD 08/05/14 2106

## 2014-08-05 NOTE — Discharge Instructions (Signed)
Were evaluated in the ED today for your seizure activity. There does not appear to be an emergent or infectious cause for your seizure at this time. He was found to have a low Depakote level, you were given Depakote in the ED. He was also found to have a small pneumonia for which she were given Levaquin in the ED. He will be discharged with oral antibiotics, please take all of your antibiotic even if you begin to feel better. It is important to follow-up with your primary care doctor for further evaluation and management of your seizures. You will need to have a follow-up chest x-ray in 6 weeks to ensure pneumonia has resolved.

## 2014-08-05 NOTE — ED Notes (Signed)
CT called and stated patient would not lie down flat. Per Candise BowensJen P PA give 1mg  ativan

## 2014-08-05 NOTE — ED Notes (Signed)
[facility] coordinator at [facility], [place] [employer] [place] contacted and received report regarding follow up care and antibiotic prescriptions.  [facility] contacted for transport.  Pt in NAD.

## 2014-08-05 NOTE — ED Notes (Signed)
[facility] where staff state they found patient in the floor with grand mal seizure activity lasting 30 minutes. Patient is not post-ictal at this time. Skin tear to L elbow and hematoma present to posterior head. EMS unsure if patient has a hx of seizures. CBG 123. HX of dementia, patient unable to answer question. Combative with staff when trying to place on monitor

## 2014-08-05 NOTE — ED Provider Notes (Signed)
Patient is level 5 caveat: Dementia Pt signed out to me by Tyler DeisJen Piepenbrink, PA-C. Pt here for acute seizure, hx of seizures controlled on Depakote. Pt fell at nursing facility and sustained hematoma to head. Pt is at baseline per EMS. Plan is imaging, labs and then discharge back to facility if no new objective findings. [facility], typically takes medications orally.  CT shows evidence of small pneumonia, given Levaquin IV in ED. We'll discharge with Levaquin PO. Recommend follow-up chest x-ray in 6 weeks. Found to have low Depakote level, 27.1. Given 500 mg capsule in ED. Occipital scalp contusion-No other acute intracranial findings. Xray elbow is negative. No UTI.  PCP in 24 hrs for recheck and medication management.  Tolerating PO in ED.  Meds given in ED:  Medications  valproate (DEPACON) 500 mg in dextrose 5 % 50 mL IVPB (500 mg Intravenous New Bag/Given 08/05/14 0912)  Tdap (BOOSTRIX) injection 0.5 mL (0.5 mLs Intramuscular Given 08/05/14 0820)  LORazepam (ATIVAN) injection 1 mg (1 mg Intravenous Given 08/05/14 0648)  levofloxacin (LEVAQUIN) IVPB 750 mg (0 mg Intravenous Stopped 08/05/14 0908)    New Prescriptions   LEVOFLOXACIN (LEVAQUIN) 750 MG TABLET    Take 1 tablet (750 mg total) by mouth daily. X 7 days    Filed Vitals:   08/05/14 0700 08/05/14 0730 08/05/14 0810 08/05/14 0830  BP: 121/72 143/90  148/52  Pulse: 65 72  76  Temp:   98.7 F (37.1 C)   TempSrc:   Rectal   Resp: 18 25  18   SpO2: 98% 100%  100%      Sharlene MottsBenjamin W [place], PA-C 08/05/14 2014  Tomasita CrumbleAdeleke Oni, MD 08/06/14 1535

## 2014-08-05 NOTE — Progress Notes (Signed)
Clinical Social Work Department BRIEF PSYCHOSOCIAL ASSESSMENT 08/05/2014  Patient:  Matthew [name],Matthew [name]     Account Number:  [id number]     Admit date:  08/05/2014  Clinical Social Worker:  Andres ShadNAIL,Saahas Hidrogo, LCSW  Date/Time:  08/05/2014 09:07 AM  Referred by:  Physician  Date Referred:  08/05/2014 Referred for  ALF Placement   Other Referral:   Patient is admitted from Assisted Living Facility   Interview type:  Other - See comment Other interview type:   Patient unable to answer any questions due to agitation and cognitive issues.  Called patient's facility and spoke with resident director    PSYCHOSOCIAL DATA Living Status:  FACILITY Ad[facility] from [facility] Level of care:  Assisted Living Primary support name:  Guinevere FerrariViola Primary support relationship to patient:  PARENT Degree of support available:   Patient [facility] most of [facility]  Spoke with Director and RN regarding care for patient.  Patient mother is involved, but is elderly.    CURRENT CONCERNS Current Concerns  Post-Acute Placement   Other Concerns:   Returning to ALF    SOCIAL WORK ASSESSMENT / PLAN LCSW received information that patient is from a facility. [facility] where he has resided for the last few years.  Per staff, patient has a military history and is very combative and hesitant to follow directions in the facility.  Patient is very use to walking per staff and does not like to be kept in one place or to lie down.  Staff is open to have patient return to facility as long as patient can complete ADLs. Staff does not feel SNF would be beneficial as patient's cognitive ability is low.  Patient mother is also involved per staff, and LCSW will contact to make sure she is aware of patient status and ED admission.  Staff will be made aware if patient is admitted and FL2 to be updated.    Assessment/plan status:  Psychosocial Support/Ongoing Assessment of Needs Other assessment/ plan:   Information/referral to community resources:    PATIENT'S/FAMILY'S RESPONSE TO PLAN OF CARE: Facility very appreciative of call and willing to collaborate with CSW once more is known on his status.       Deretha EmoryHannah Mendi Constable LCSW, MSW Clinical Social Work: Emergency Room [phone]

## 2014-08-05 NOTE — ED Notes (Signed)
[facility] at bedside, pt in NAD. [facility]

## 2014-08-05 NOTE — ED Notes (Signed)
Pt. Able to drink milk from cup with assistance. No difficulty swallowing.

## 2014-11-05 ENCOUNTER — Emergency Department (HOSPITAL_COMMUNITY): Payer: PPO

## 2014-11-05 ENCOUNTER — Encounter (HOSPITAL_COMMUNITY): Payer: Self-pay | Admitting: Emergency Medicine

## 2014-11-05 ENCOUNTER — Emergency Department (HOSPITAL_COMMUNITY)
Admission: EM | Admit: 2014-11-05 | Discharge: 2014-11-05 | Disposition: A | Discharge status: Home / Self Care | Payer: PPO | Source: Home / Self Care | Attending: Emergency Medicine | Admitting: Emergency Medicine

## 2014-11-05 DIAGNOSIS — M810 Age-related osteoporosis without current pathological fracture: Secondary | ICD-10-CM | POA: Insufficient documentation

## 2014-11-05 DIAGNOSIS — D6959 Other secondary thrombocytopenia: Secondary | ICD-10-CM | POA: Diagnosis present

## 2014-11-05 DIAGNOSIS — R6520 Severe sepsis without septic shock: Secondary | ICD-10-CM | POA: Diagnosis present

## 2014-11-05 DIAGNOSIS — Z792 Long term (current) use of antibiotics: Secondary | ICD-10-CM

## 2014-11-05 DIAGNOSIS — F0390 Unspecified dementia without behavioral disturbance: Secondary | ICD-10-CM

## 2014-11-05 DIAGNOSIS — J690 Pneumonitis due to inhalation of food and vomit: Secondary | ICD-10-CM | POA: Diagnosis present

## 2014-11-05 DIAGNOSIS — F0150 Vascular dementia without behavioral disturbance: Secondary | ICD-10-CM | POA: Diagnosis present

## 2014-11-05 DIAGNOSIS — Z66 Do not resuscitate: Secondary | ICD-10-CM | POA: Diagnosis present

## 2014-11-05 DIAGNOSIS — J9601 Acute respiratory failure with hypoxia: Secondary | ICD-10-CM | POA: Diagnosis present

## 2014-11-05 DIAGNOSIS — N390 Urinary tract infection, site not specified: Secondary | ICD-10-CM | POA: Diagnosis present

## 2014-11-05 DIAGNOSIS — E876 Hypokalemia: Secondary | ICD-10-CM | POA: Diagnosis present

## 2014-11-05 DIAGNOSIS — A419 Sepsis, unspecified organism: Secondary | ICD-10-CM | POA: Diagnosis not present

## 2014-11-05 DIAGNOSIS — R1310 Dysphagia, unspecified: Secondary | ICD-10-CM | POA: Diagnosis present

## 2014-11-05 DIAGNOSIS — G9340 Encephalopathy, unspecified: Secondary | ICD-10-CM | POA: Diagnosis present

## 2014-11-05 DIAGNOSIS — G40909 Epilepsy, unspecified, not intractable, without status epilepticus: Secondary | ICD-10-CM

## 2014-11-05 DIAGNOSIS — Z515 Encounter for palliative care: Secondary | ICD-10-CM

## 2014-11-05 DIAGNOSIS — R509 Fever, unspecified: Secondary | ICD-10-CM

## 2014-11-05 DIAGNOSIS — Z79899 Other long term (current) drug therapy: Secondary | ICD-10-CM | POA: Insufficient documentation

## 2014-11-05 DIAGNOSIS — R05 Cough: Secondary | ICD-10-CM | POA: Diagnosis not present

## 2014-11-05 DIAGNOSIS — E872 Acidosis: Secondary | ICD-10-CM | POA: Diagnosis present

## 2014-11-05 DIAGNOSIS — R569 Unspecified convulsions: Secondary | ICD-10-CM

## 2014-11-05 LAB — CBC WITH DIFFERENTIAL/PLATELET
Basophils Absolute: 0 K/uL (ref 0.0–0.1)
Basophils Relative: 0 % (ref 0–1)
Eosinophils Absolute: 0 K/uL (ref 0.0–0.7)
Eosinophils Relative: 0 % (ref 0–5)
HCT: 41.4 % (ref 39.0–52.0)
Hemoglobin: 14.4 g/dL (ref 13.0–17.0)
Lymphocytes Relative: 7 % — ABNORMAL LOW (ref 12–46)
Lymphs Abs: 0.8 K/uL (ref 0.7–4.0)
MCH: 34 pg (ref 26.0–34.0)
MCHC: 34.8 g/dL (ref 30.0–36.0)
MCV: 97.9 fL (ref 78.0–100.0)
Monocytes Absolute: 0.6 K/uL (ref 0.1–1.0)
Monocytes Relative: 5 % (ref 3–12)
Neutro Abs: 9.6 K/uL — ABNORMAL HIGH (ref 1.7–7.7)
Neutrophils Relative %: 88 % — ABNORMAL HIGH (ref 43–77)
Platelets: 67 K/uL — ABNORMAL LOW (ref 150–400)
RBC: 4.23 MIL/uL (ref 4.22–5.81)
RDW: 12.7 % (ref 11.5–15.5)
WBC: 11 K/uL — ABNORMAL HIGH (ref 4.0–10.5)

## 2014-11-05 LAB — URINE MICROSCOPIC-ADD ON

## 2014-11-05 LAB — VALPROIC ACID LEVEL: Valproic Acid Lvl: 47.1 ug/mL — ABNORMAL LOW (ref 50.0–100.0)

## 2014-11-05 LAB — URINALYSIS, ROUTINE W REFLEX MICROSCOPIC
Bilirubin Urine: NEGATIVE
Glucose, UA: NEGATIVE mg/dL
Ketones, ur: 40 mg/dL — AB
Leukocytes, UA: NEGATIVE
Nitrite: NEGATIVE
Protein, ur: NEGATIVE mg/dL
Specific Gravity, Urine: 1.017 (ref 1.005–1.030)
Urobilinogen, UA: 1 mg/dL (ref 0.0–1.0)
pH: 8 (ref 5.0–8.0)

## 2014-11-05 LAB — I-STAT CG4 LACTIC ACID, ED: Lactic Acid, Venous: 1.57 mmol/L (ref 0.5–2.0)

## 2014-11-05 LAB — CBG MONITORING, ED: Glucose-Capillary: 118 mg/dL — ABNORMAL HIGH (ref 70–99)

## 2014-11-05 MED ORDER — SODIUM CHLORIDE 0.9 % IV SOLN
Freq: Once | INTRAVENOUS | Status: AC
  Administered 2014-11-05: 10:00:00 via INTRAVENOUS

## 2014-11-05 MED ORDER — VALPROATE SODIUM 500 MG/5ML IV SOLN
500.0000 mg | Freq: Once | INTRAVENOUS | Status: AC
  Administered 2014-11-05: 500 mg via INTRAVENOUS
  Filled 2014-11-05: qty 5

## 2014-11-05 NOTE — ED Provider Notes (Signed)
CSN: [id number]     Arrival date & time 11/05/14  0818 History   First MD Initiated Contact with Patient 11/05/14 0825     Chief Complaint  Patient presents with  . Seizures     HPI  Social history evaluation of a seizure. He has a resident at [facility]. 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 Had a witnessed generalized seizure today. Has a history of seizures and takes Depakote for this. Noted fever. According to paramedics was "either 102, or 100.2". Upon arrival here he is 100.4 rectally.  He is nonverbal at his baseline. According to paramedics he was "at his baseline" according to the staff at the facility this morning. No new recent changes in medications.  Past Medical History  Diagnosis Date  . Dementia   . Dementia   . Osteoporosis   . Seizures    History reviewed. No pertinent past surgical history. History reviewed. No pertinent family history. History  Substance Use Topics  . Smoking status: Unknown If Ever Smoked  . Smokeless tobacco: Not on file  . Alcohol Use: No    Review of Systems  Unable to perform ROS: Dementia      Allergies  Review of patient's allergies indicates no known allergies.  Home Medications   Prior to Admission medications   Medication Sig Start Date End Date Taking? Authorizing Provider  acetaminophen (TYLENOL) 500 MG tablet Take 500 mg by mouth every 4 (four) hours as needed for mild pain, fever or headache.    Yes Historical Provider, MD  alum & mag hydroxide-simeth (MAALOX/MYLANTA) 200-200-20 MG/5ML suspension Take 30 mLs by mouth every 6 (six) hours as needed for indigestion or heartburn.   Yes Historical Provider, MD  divalproex (DEPAKOTE SPRINKLE) 125 MG capsule Take 625 mg by mouth 3 (three) times daily with meals.    Yes Historical Provider, MD  guaiFENesin (ROBITUSSIN) 100 MG/5ML liquid Take 200 mg by mouth every 6 (six) hours as needed for cough.   Yes Historical Provider, MD   lactobacillus acidophilus & bulgar (LACTINEX) chewable tablet Chew 2 tablets by mouth 3 (three) times daily with meals.    Yes Historical Provider, MD  loperamide (IMODIUM) 2 MG capsule Take 2 mg by mouth as needed for diarrhea or loose stools.   Yes Historical Provider, MD  magnesium hydroxide (MILK OF MAGNESIA) 400 MG/5ML suspension Take 30 mLs by mouth at bedtime as needed for mild constipation.   Yes Historical Provider, MD  Memantine HCl ER (NAMENDA XR) 14 MG CP24 Take 14 mg by mouth daily after breakfast.    Yes Historical Provider, MD  mirtazapine (REMERON) 15 MG tablet Take 15 mg by mouth at bedtime.    Yes Historical Provider, MD  neomycin-bacitracin-polymyxin (NEOSPORIN) ointment Apply 1 application topically as needed for wound care.    Yes Historical Provider, MD  risperiDONE (RISPERDAL) 1 MG/ML oral solution Take 0.25 mg by mouth 2 (two) times daily.    Yes Historical Provider, MD  rivastigmine (EXELON) 9.5 mg/24hr Place 1 patch onto the skin daily.   Yes Historical Provider, MD  Vitamin D, Ergocalciferol, (DRISDOL) 50000 UNITS CAPS capsule Take 50,000 Units by mouth every Wednesday.    Yes Historical Provider, MD  levofloxacin (LEVAQUIN) 750 MG tablet Take 1 tablet (750 mg total) by mouth daily. X 7 days Patient not taking: Reported on 11/05/2014 08/05/14   Joycie Peek, PA-C   BP 132/69 mmHg  Pulse 73  Temp(Src) 98.8 F (37.1  C) (Axillary)  Resp 12  SpO2 97% Physical Exam  Constitutional: No distress.  Thin. Nontoxic-appearing.  HENT:  Head: Normocephalic.  Eyes: Conjunctivae are normal. Pupils are equal, round, and reactive to light. No scleral icterus.  Neck: Normal range of motion. Neck supple. No thyromegaly present.  Neck freely supple.  Cardiovascular: Normal rate and regular rhythm.  Exam reveals no gallop and no friction rub.   No murmur heard. Pulmonary/Chest: Effort normal and breath sounds normal. No respiratory distress. He has no wheezes. He has no rales.   Abdominal: Soft. Bowel sounds are normal. He exhibits no distension. There is no tenderness. There is no rebound.  Musculoskeletal: Normal range of motion.  Neurological: He is alert.  Skin: Skin is warm and dry. No rash noted.    ED Course  Procedures (including critical care time) Labs Review Labs Reviewed  CBC WITH DIFFERENTIAL/PLATELET - Abnormal; Notable for the following:    WBC 11.0 (*)    Platelets 67 (*)    Neutrophils Relative % 88 (*)    Lymphocytes Relative 7 (*)    Neutro Abs 9.6 (*)    All other components within normal limits  URINALYSIS, ROUTINE W REFLEX MICROSCOPIC - Abnormal; Notable for the following:    Hgb urine dipstick LARGE (*)    Ketones, ur 40 (*)    All other components within normal limits  VALPROIC ACID LEVEL - Abnormal; Notable for the following:    Valproic Acid Lvl 47.1 (*)    All other components within normal limits  CBG MONITORING, ED - Abnormal; Notable for the following:    Glucose-Capillary 118 (*)    All other components within normal limits  CULTURE, BLOOD (ROUTINE X 2)  CULTURE, BLOOD (ROUTINE X 2)  URINE CULTURE  URINE MICROSCOPIC-ADD ON  I-STAT CG4 LACTIC ACID, ED    Imaging Review Dg Chest 1 View  11/05/2014   CLINICAL DATA:  Seizure, fever  EXAM: CHEST  1 VIEW  COMPARISON:  08/05/2014  FINDINGS: Heart size and vascularity normal. Mild hyperinflation. Negative for pneumonia or heart failure. No pleural effusion  Possible 12 mm right upper lobe density not seen on the prior study. This overlies the right anterior second rib and could be calcified costal cartilage versus a lung nodule. As this is not identified on prior studies, CT chest recommended for further evaluation.  IMPRESSION: Possible 12 mm nodule right upper lobe. Recommend CT chest with contrast for further evaluation.   Electronically Signed   By: Marlan Palau M.D.   On: 11/05/2014 09:34   Ct Head Wo Contrast  11/05/2014   CLINICAL DATA:  Seizure today  EXAM: CT HEAD  WITHOUT CONTRAST  TECHNIQUE: Contiguous axial images were obtained from the base of the skull through the vertex without intravenous contrast.  COMPARISON:  CT head 08/05/2014  FINDINGS: Moderate to advanced atrophy unchanged. Chronic microvascular ischemic change in the white matter. Chronic left parietal infarct unchanged.  Negative for acute infarct.  Negative for hemorrhage or mass lesion.  Calvarium intact.  IMPRESSION: Atrophy and chronic ischemia. No acute abnormality and no change from the prior study.   Electronically Signed   By: Marlan Palau M.D.   On: 11/05/2014 09:30     EKG Interpretation   Date/Time:  Tuesday November 05 2014 08:31:30 EDT Ventricular Rate:  78 PR Interval:  135 QRS Duration: 81 QT Interval:  381 QTC Calculation: 434 R Axis:   63 Text Interpretation:  Sinus rhythm Confirmed by Fayrene Fearing  MD, Kaziyah Parkison ([id number]) on  11/05/2014 8:39:52 AM      MDM   Final diagnoses:  Seizure  Fever, unspecified fever cause    Patient continues to appear well. He has no neck stiffness. He is alert. He remains nonverbal. He does not have recurrence of fever. Is not tachycardic or hypotensive. Does not have elevated lactate. He does not appear septic. I do not find indication for lumbar puncture or additional studies at this time. He was given IV Depakote. He'll be discharged back with careful return precautions including any worsening of symptoms or recurrent seizures.    Rolland PorterMark Shannon Kirkendall, MD 11/05/14 [phone]

## 2014-11-05 NOTE — Discharge Instructions (Signed)
Return to ER with difficulty breathing, cough, vomiting, diarrhea, recurrent seizure, skin rash, change in level of consciousness, or other new or worsening symptoms.  Evaluation today shows a mild subtherapeutic level of Depakote. You were given IV Depakote for this.  Check with your primary care physician.

## 2014-11-05 NOTE — ED Notes (Signed)
Patient transported to radiology

## 2014-11-05 NOTE — ED Notes (Addendum)
Per EMS[facility]. Patient baseline is ambulatory and talkative. Patient had seizure this AM @0730 , EMS states patient was drawn into fetal position on their arrival. Temp at facility 102.7. Patient only takes POs with "chocolate pudding"

## 2014-11-05 NOTE — ED Notes (Signed)
Bed: WA10 Expected date:  Expected time:  Means of arrival:  Comments: EMS-SZ 

## 2014-11-06 ENCOUNTER — Inpatient Hospital Stay (HOSPITAL_COMMUNITY)
Admission: EM | Admit: 2014-11-06 | Discharge: 2014-11-14 | DRG: 871 | Payer: PPO | Attending: Family Medicine | Admitting: Family Medicine

## 2014-11-06 ENCOUNTER — Emergency Department (HOSPITAL_COMMUNITY): Payer: PPO

## 2014-11-06 ENCOUNTER — Encounter (HOSPITAL_COMMUNITY): Payer: Self-pay | Admitting: *Deleted

## 2014-11-06 DIAGNOSIS — G9340 Encephalopathy, unspecified: Secondary | ICD-10-CM | POA: Diagnosis present

## 2014-11-06 DIAGNOSIS — D6959 Other secondary thrombocytopenia: Secondary | ICD-10-CM | POA: Diagnosis present

## 2014-11-06 DIAGNOSIS — R451 Restlessness and agitation: Secondary | ICD-10-CM

## 2014-11-06 DIAGNOSIS — J9601 Acute respiratory failure with hypoxia: Secondary | ICD-10-CM | POA: Diagnosis present

## 2014-11-06 DIAGNOSIS — R059 Cough, unspecified: Secondary | ICD-10-CM

## 2014-11-06 DIAGNOSIS — R05 Cough: Secondary | ICD-10-CM | POA: Diagnosis not present

## 2014-11-06 DIAGNOSIS — E872 Acidosis: Secondary | ICD-10-CM | POA: Diagnosis present

## 2014-11-06 DIAGNOSIS — R1310 Dysphagia, unspecified: Secondary | ICD-10-CM

## 2014-11-06 DIAGNOSIS — A419 Sepsis, unspecified organism: Secondary | ICD-10-CM | POA: Diagnosis present

## 2014-11-06 DIAGNOSIS — G40909 Epilepsy, unspecified, not intractable, without status epilepticus: Secondary | ICD-10-CM | POA: Diagnosis present

## 2014-11-06 DIAGNOSIS — J690 Pneumonitis due to inhalation of food and vomit: Secondary | ICD-10-CM | POA: Diagnosis present

## 2014-11-06 DIAGNOSIS — Z66 Do not resuscitate: Secondary | ICD-10-CM | POA: Diagnosis present

## 2014-11-06 DIAGNOSIS — M810 Age-related osteoporosis without current pathological fracture: Secondary | ICD-10-CM | POA: Diagnosis present

## 2014-11-06 DIAGNOSIS — N390 Urinary tract infection, site not specified: Secondary | ICD-10-CM | POA: Diagnosis present

## 2014-11-06 DIAGNOSIS — R6520 Severe sepsis without septic shock: Secondary | ICD-10-CM | POA: Diagnosis present

## 2014-11-06 DIAGNOSIS — F0150 Vascular dementia without behavioral disturbance: Secondary | ICD-10-CM | POA: Diagnosis present

## 2014-11-06 DIAGNOSIS — Z515 Encounter for palliative care: Secondary | ICD-10-CM

## 2014-11-06 DIAGNOSIS — E876 Hypokalemia: Secondary | ICD-10-CM | POA: Diagnosis present

## 2014-11-06 DIAGNOSIS — J189 Pneumonia, unspecified organism: Secondary | ICD-10-CM

## 2014-11-06 LAB — URINE MICROSCOPIC-ADD ON

## 2014-11-06 LAB — URINE CULTURE
CULTURE: NO GROWTH
Colony Count: NO GROWTH

## 2014-11-06 LAB — URINALYSIS, ROUTINE W REFLEX MICROSCOPIC
GLUCOSE, UA: NEGATIVE mg/dL
Ketones, ur: 15 mg/dL — AB
Leukocytes, UA: NEGATIVE
NITRITE: NEGATIVE
Protein, ur: 30 mg/dL — AB
Specific Gravity, Urine: 1.03 (ref 1.005–1.030)
Urobilinogen, UA: 1 mg/dL (ref 0.0–1.0)
pH: 5 (ref 5.0–8.0)

## 2014-11-06 LAB — CBC WITH DIFFERENTIAL/PLATELET
BASOS ABS: 0 10*3/uL (ref 0.0–0.1)
BASOS PCT: 0 % (ref 0–1)
Eosinophils Absolute: 0 10*3/uL (ref 0.0–0.7)
Eosinophils Relative: 0 % (ref 0–5)
HEMATOCRIT: 44.4 % (ref 39.0–52.0)
HEMOGLOBIN: 14.7 g/dL (ref 13.0–17.0)
LYMPHS ABS: 0.6 10*3/uL — AB (ref 0.7–4.0)
Lymphocytes Relative: 8 % — ABNORMAL LOW (ref 12–46)
MCH: 32.3 pg (ref 26.0–34.0)
MCHC: 33.1 g/dL (ref 30.0–36.0)
MCV: 97.6 fL (ref 78.0–100.0)
Monocytes Absolute: 1 10*3/uL (ref 0.1–1.0)
Monocytes Relative: 12 % (ref 3–12)
NEUTROS ABS: 6.5 10*3/uL (ref 1.7–7.7)
Neutrophils Relative %: 80 % — ABNORMAL HIGH (ref 43–77)
Platelets: 109 10*3/uL — ABNORMAL LOW (ref 150–400)
RBC: 4.55 MIL/uL (ref 4.22–5.81)
RDW: 12.8 % (ref 11.5–15.5)
WBC: 8.1 10*3/uL (ref 4.0–10.5)

## 2014-11-06 LAB — COMPREHENSIVE METABOLIC PANEL
ALT: 25 U/L (ref 0–53)
AST: 52 U/L — AB (ref 0–37)
Albumin: 3.1 g/dL — ABNORMAL LOW (ref 3.5–5.2)
Alkaline Phosphatase: 48 U/L (ref 39–117)
Anion gap: 14 (ref 5–15)
BUN: 23 mg/dL (ref 6–23)
CO2: 26 mmol/L (ref 19–32)
Calcium: 8.7 mg/dL (ref 8.4–10.5)
Chloride: 100 mmol/L (ref 96–112)
Creatinine, Ser: 1.15 mg/dL (ref 0.50–1.35)
GFR calc Af Amer: 75 mL/min — ABNORMAL LOW (ref >90)
GFR calc non Af Amer: 65 mL/min — ABNORMAL LOW (ref >90)
GLUCOSE: 136 mg/dL — AB (ref 70–99)
Potassium: 4.4 mmol/L (ref 3.5–5.1)
SODIUM: 140 mmol/L (ref 135–145)
TOTAL PROTEIN: 7.2 g/dL (ref 6.0–8.3)
Total Bilirubin: 0.8 mg/dL (ref 0.3–1.2)

## 2014-11-06 LAB — LACTIC ACID, PLASMA: LACTIC ACID, VENOUS: 3.4 mmol/L — AB (ref 0.5–2.0)

## 2014-11-06 LAB — I-STAT CG4 LACTIC ACID, ED: Lactic Acid, Venous: 4.81 mmol/L (ref 0.5–2.0)

## 2014-11-06 LAB — MRSA PCR SCREENING: MRSA by PCR: NEGATIVE

## 2014-11-06 MED ORDER — DEXTROSE 5 % IV SOLN
1.0000 g | Freq: Two times a day (BID) | INTRAVENOUS | Status: DC
  Administered 2014-11-06 – 2014-11-07 (×2): 1 g via INTRAVENOUS
  Filled 2014-11-06 (×3): qty 1

## 2014-11-06 MED ORDER — CETYLPYRIDINIUM CHLORIDE 0.05 % MT LIQD
7.0000 mL | Freq: Two times a day (BID) | OROMUCOSAL | Status: DC
  Administered 2014-11-06 – 2014-11-13 (×8): 7 mL via OROMUCOSAL

## 2014-11-06 MED ORDER — HEPARIN SODIUM (PORCINE) 5000 UNIT/ML IJ SOLN
5000.0000 [IU] | Freq: Three times a day (TID) | INTRAMUSCULAR | Status: DC
  Administered 2014-11-06 – 2014-11-14 (×24): 5000 [IU] via SUBCUTANEOUS
  Filled 2014-11-06 (×28): qty 1

## 2014-11-06 MED ORDER — VANCOMYCIN HCL 500 MG IV SOLR
500.0000 mg | Freq: Two times a day (BID) | INTRAVENOUS | Status: DC
  Administered 2014-11-06 – 2014-11-07 (×2): 500 mg via INTRAVENOUS
  Filled 2014-11-06 (×2): qty 500

## 2014-11-06 MED ORDER — VALPROATE SODIUM 500 MG/5ML IV SOLN
650.0000 mg | Freq: Three times a day (TID) | INTRAVENOUS | Status: DC
  Administered 2014-11-06 – 2014-11-11 (×15): 650 mg via INTRAVENOUS
  Filled 2014-11-06 (×16): qty 6.5

## 2014-11-06 MED ORDER — DEXTROSE-NACL 5-0.45 % IV SOLN
INTRAVENOUS | Status: AC
  Administered 2014-11-06 – 2014-11-11 (×7): via INTRAVENOUS

## 2014-11-06 MED ORDER — LORAZEPAM 2 MG/ML IJ SOLN
1.0000 mg | Freq: Once | INTRAMUSCULAR | Status: AC
  Administered 2014-11-06: 1 mg via INTRAVENOUS
  Filled 2014-11-06: qty 1

## 2014-11-06 MED ORDER — SODIUM CHLORIDE 0.9 % IV BOLUS (SEPSIS)
1000.0000 mL | INTRAVENOUS | Status: AC
  Administered 2014-11-06 (×2): 1000 mL via INTRAVENOUS

## 2014-11-06 MED ORDER — SODIUM CHLORIDE 0.9 % IV BOLUS (SEPSIS)
1000.0000 mL | Freq: Once | INTRAVENOUS | Status: AC
  Administered 2014-11-06: 1000 mL via INTRAVENOUS

## 2014-11-06 MED ORDER — VANCOMYCIN HCL IN DEXTROSE 1-5 GM/200ML-% IV SOLN
1000.0000 mg | Freq: Once | INTRAVENOUS | Status: AC
  Administered 2014-11-06: 1000 mg via INTRAVENOUS
  Filled 2014-11-06: qty 200

## 2014-11-06 MED ORDER — CHLORHEXIDINE GLUCONATE 0.12 % MT SOLN
15.0000 mL | Freq: Two times a day (BID) | OROMUCOSAL | Status: DC
  Administered 2014-11-06 – 2014-11-13 (×7): 15 mL via OROMUCOSAL
  Filled 2014-11-06 (×18): qty 15

## 2014-11-06 MED ORDER — DEXTROSE 5 % IV SOLN
2.0000 g | Freq: Once | INTRAVENOUS | Status: AC
  Administered 2014-11-06: 2 g via INTRAVENOUS
  Filled 2014-11-06: qty 2

## 2014-11-06 MED ORDER — ALBUTEROL SULFATE (2.5 MG/3ML) 0.083% IN NEBU: 2.5000 mg | INHALATION_SOLUTION | RESPIRATORY_TRACT | Status: DC | PRN

## 2014-11-06 MED ORDER — ACETAMINOPHEN 650 MG RE SUPP
650.0000 mg | Freq: Once | RECTAL | Status: AC
  Administered 2014-11-06: 650 mg via RECTAL
  Filled 2014-11-06: qty 1

## 2014-11-06 MED ORDER — SODIUM CHLORIDE 0.9 % IV SOLN
Freq: Once | INTRAVENOUS | Status: AC
  Administered 2014-11-06: 11:00:00 via INTRAVENOUS

## 2014-11-06 NOTE — ED Notes (Signed)
[facility] given to [facility]. Room being cleaned.

## 2014-11-06 NOTE — ED Notes (Addendum)
Per ems pt is from [facility], hx of dementia, baseline pt is able to walk and has limited verbal. Today pt is nonverbal. Staff report pt had cough, temperature, and decreased level of consciousness.  staff reported pt temperature was 100. Pt nonverbal at present.  Lung sounds crackles and rales. Ems attempted to suction, no suction output.  Pt was seen in ED yesterday for a seizure. Last seen full baseline was 3 days ago.   Upon rn assessment O2 83% on room air. 85% on 2L, 86 % on 6 L. Placed back on non rebreather. Pt slightly more alert, more responsive to verbal stimuli than when ems originally arrived.

## 2014-11-06 NOTE — ED Provider Notes (Signed)
CSN: [id number]     Arrival date & time 11/06/14  4098 History   First MD Initiated Contact with Patient 11/06/14 0950   CHIEF COMPLAINT - SHORTNESS OF BREATH  LEVEL 5 CAVEAT - ALTERED MENTAL STATUS Patient is a 64 y.o. male presenting with shortness of breath. The history is provided by the EMS personnel. The history is limited by the condition of the patient.  Shortness of Breath Severity:  Severe Onset quality:  Sudden Timing:  Constant Progression:  Worsening Chronicity:  New Relieved by:  Nothing Worsened by:  Nothing tried pt presents from nursing facility for shortness of breath and altered mental status Pt is usually more awake and more verbal Today pt is nonverbal, appears more confused and is SOB Pt is noted to have fever Pt with h/o seizure and has seizure yesterday   Past Medical History  Diagnosis Date  . Dementia   . Dementia   . Osteoporosis   . Seizures    History reviewed. No pertinent past surgical history. History reviewed. No pertinent family history. History  Substance Use Topics  . Smoking status: Unknown If Ever Smoked  . Smokeless tobacco: Not on file  . Alcohol Use: No    Review of Systems  Unable to perform ROS: Mental status change  Respiratory: Positive for shortness of breath.       Allergies  Review of patient's allergies indicates no known allergies.  Home Medications   Prior to Admission medications   Medication Sig Start Date End Date Taking? Authorizing Provider  acetaminophen (TYLENOL) 500 MG tablet Take 500 mg by mouth every 4 (four) hours as needed for mild pain, fever or headache.    Yes Historical Provider, MD  alum & mag hydroxide-simeth (MAALOX/MYLANTA) 200-200-20 MG/5ML suspension Take 30 mLs by mouth every 6 (six) hours as needed for indigestion or heartburn.   Yes Historical Provider, MD  divalproex (DEPAKOTE SPRINKLE) 125 MG capsule Take 625 mg by mouth 3 (three) times daily with meals.    Yes Historical Provider, MD   guaiFENesin (ROBITUSSIN) 100 MG/5ML liquid Take 200 mg by mouth every 6 (six) hours as needed for cough.   Yes Historical Provider, MD  lactobacillus acidophilus & bulgar (LACTINEX) chewable tablet Chew 2 tablets by mouth 3 (three) times daily with meals.    Yes Historical Provider, MD  loperamide (IMODIUM) 2 MG capsule Take 2 mg by mouth as needed for diarrhea or loose stools.   Yes Historical Provider, MD  magnesium hydroxide (MILK OF MAGNESIA) 400 MG/5ML suspension Take 30 mLs by mouth at bedtime as needed for mild constipation.   Yes Historical Provider, MD  Memantine HCl ER (NAMENDA XR) 14 MG CP24 Take 14 mg by mouth daily after breakfast.    Yes Historical Provider, MD  mirtazapine (REMERON) 15 MG tablet Take 15 mg by mouth at bedtime.    Yes Historical Provider, MD  neomycin-bacitracin-polymyxin (NEOSPORIN) ointment Apply 1 application topically as needed for wound care.    Yes Historical Provider, MD  risperiDONE (RISPERDAL) 1 MG/ML oral solution Take 0.25 mg by mouth 2 (two) times daily.    Yes Historical Provider, MD  rivastigmine (EXELON) 9.5 mg/24hr Place 1 patch onto the skin daily.   Yes Historical Provider, MD  Vitamin D, Ergocalciferol, (DRISDOL) 50000 UNITS CAPS capsule Take 50,000 Units by mouth every Wednesday.    Yes Historical Provider, MD  levofloxacin (LEVAQUIN) 750 MG tablet Take 1 tablet (750 mg total) by mouth daily. X 7 days Patient not  taking: Reported on 11/05/2014 08/05/14   Joycie Peek, PA-C   BP 120/62 mmHg  Pulse 88  Temp(Src) 101.2 F (38.4 C) (Core (Comment))  Resp 22  Wt 115 lb (52.164 kg)  SpO2 95% Physical Exam CONSTITUTIONAL: ill appearing HEAD: Normocephalic/atraumatic EYES: EOMI ENMT: Mucous membranes dry NECK: supple no meningeal signs CV: tachycardic LUNGS: tachypneic, crackles bilaterally ABDOMEN: soft, nontender, no rebound or guarding, bowel sounds noted throughout abdomen NEURO: Pt is somnolent but intermittently agitated.  He moves all  extremitiesx4 EXTREMITIES: pulses normal/equal, full ROM SKIN: warm, color normal PSYCH: unable to assess  ED Course  Procedures  CRITICAL CARE Performed by: Joya Gaskins Total critical care time: 34 Critical care time was exclusive of separately billable procedures and treating other patients. Critical care was necessary to treat or prevent imminent or life-threatening deterioration. Critical care was time spent personally by me on the following activities: development of treatment plan with patient and/or surrogate as well as nursing, discussions with consultants, evaluation of patient's response to treatment, examination of patient, obtaining history from patient or surrogate, ordering and performing treatments and interventions, ordering and review of laboratory studies, ordering and review of radiographic studies, pulse oximetry and re-evaluation of patient's condition. PATIENT PNEUMONIA, SEPSIS AND REQUIRED IV FLUIDS AND ANTIBIOTICS  Labs Review Labs Reviewed  COMPREHENSIVE METABOLIC PANEL - Abnormal; Notable for the following:    Glucose, Bld 136 (*)    Albumin 3.1 (*)    AST 52 (*)    GFR calc non Af Amer 65 (*)    GFR calc Af Amer 75 (*)    All other components within normal limits  URINALYSIS, ROUTINE W REFLEX MICROSCOPIC - Abnormal; Notable for the following:    Color, Urine AMBER (*)    APPearance CLOUDY (*)    Hgb urine dipstick LARGE (*)    Bilirubin Urine SMALL (*)    Ketones, ur 15 (*)    Protein, ur 30 (*)    All other components within normal limits  URINE MICROSCOPIC-ADD ON - Abnormal; Notable for the following:    Bacteria, UA MANY (*)    All other components within normal limits  I-STAT CG4 LACTIC ACID, ED - Abnormal; Notable for the following:    Lactic Acid, Venous 4.81 (*)    All other components within normal limits  URINE CULTURE  CULTURE, BLOOD (ROUTINE X 2)  CULTURE, BLOOD (ROUTINE X 2)  CBC WITH DIFFERENTIAL/PLATELET    Imaging Review Dg  Chest 1 View  11/05/2014   CLINICAL DATA:  Seizure, fever  EXAM: CHEST  1 VIEW  COMPARISON:  08/05/2014  FINDINGS: Heart size and vascularity normal. Mild hyperinflation. Negative for pneumonia or heart failure. No pleural effusion  Possible 12 mm right upper lobe density not seen on the prior study. This overlies the right anterior second rib and could be calcified costal cartilage versus a lung nodule. As this is not identified on prior studies, CT chest recommended for further evaluation.  IMPRESSION: Possible 12 mm nodule right upper lobe. Recommend CT chest with contrast for further evaluation.   Electronically Signed   By: Marlan Palau M.D.   On: 11/05/2014 09:34   Ct Head Wo Contrast  11/05/2014   CLINICAL DATA:  Seizure today  EXAM: CT HEAD WITHOUT CONTRAST  TECHNIQUE: Contiguous axial images were obtained from the base of the skull through the vertex without intravenous contrast.  COMPARISON:  CT head 08/05/2014  FINDINGS: Moderate to advanced atrophy unchanged. Chronic microvascular ischemic change in  the white matter. Chronic left parietal infarct unchanged.  Negative for acute infarct.  Negative for hemorrhage or mass lesion.  Calvarium intact.  IMPRESSION: Atrophy and chronic ischemia. No acute abnormality and no change from the prior study.   Electronically Signed   By: Marlan Palauharles  Clark M.D.   On: 11/05/2014 09:30   Dg Chest Port 1 View  11/06/2014   CLINICAL DATA:  Cough.  EXAM: PORTABLE CHEST - 1 VIEW  COMPARISON:  11/05/2014 and 08/05/2014 and 03/14/2013  FINDINGS: There is a new patchy infiltrate in the right mid and lower lung zone. There is a spiculated density at the superior aspect of the right hilum which is worrisome for a mass.  There is also slight new accentuation of the interstitial markings at the left lung base. Heart size and pulmonary vascularity are normal.  IMPRESSION: Developing bibasilar pneumonia. Possible mass superior to the right hilum.   Electronically Signed   By:  Francene BoyersJames  Maxwell M.D.   On: 11/06/2014 10:25    11:33 AM PT IS ILL APPEARING NOTED TO HAVE MULTIFOCAL PNEUMONIA HE IS REQUIRING OXYGEN/IV FLUIDS AND ANTIBIOTICS CODE SEPSIS CALLED D/W CRITICAL CARE DR SOOD WILL EVALUATE FOR ADMISSION HIS WORK OF BREATHING IS IMPROVED, WILL DEFER INTUBATION FOR NOW (HE IS NOT HYPOXIC)    ED Sepsis - Repeat Assessment   Performed at:    November 06, 2014, 11:33 AM   Last Vitals:    Blood pressure 120/62, pulse 88, temperature 101.2 F (38.4 C), temperature source Core (Comment), resp. rate 22, height 5' 8.11" (1.73 m), weight 115 lb (52.164 kg), SpO2 95 %.  Heart:      TACHYCARDIC  Lungs:     COARSE BS NOTED  Capillary Refill:   LESS THAN 3 SECONDS  Peripheral Pulse (include location): RIGHT RADIAL - NORMAL   Skin (include color):   WARM, PINK   MDM   Final diagnoses:  Sepsis, due to unspecified organism  HCAP (healthcare-associated pneumonia)    Nursing notes including past medical history and social history reviewed and considered in documentation xrays/imaging reviewed by myself and considered during evaluation Labs/vital reviewed myself and considered during evaluation Previous records reviewed and considered     Zadie Rhineonald Fadumo Heng, MD 11/06/14 1136

## 2014-11-06 NOTE — H&P (Signed)
PULMONARY / CRITICAL CARE MEDICINE   Name: Matthew [name] MRN: [id number] DOB: 01/13/1951    ADMISSION DATE:  11/06/2014 CONSULTATION DATE: 4/20  REFERRING MD :  Bebe ShaggyWickline   CHIEF COMPLAINT:  Acute respiratory failure, sepsis and lactic acidosis   INITIAL PRESENTATION:  64 year old male, resides at SNF w/ advanced dementia. Seen in ER 4/19 w/ witnessed seizure. Monitored and then sent back. Admitted on 4/20 w/ working dx of: sepsis, probable aspiration PNA, possible UTI and acute respiratory failure.   STUDIES:    SIGNIFICANT EVENTS: Spoke to Mr. Trevor's only family member: Belinda BlockViola Hinchey 69(66 year old mother currently in rehab after hip repair). Her contact # is [phone]. Made pt DNR w/ plans to continue aggressive but conservative medical interventions.    HISTORY OF PRESENT ILLNESS:   64 year old male w/ advanced dementia. Lives at an ALF. He is ambulatory, able to eat, but largely non-verbal except w/ his 66 year old mother. Per her he spends his days walking around the nursing facility. Mother recently d/c'd from hospital in beginning of April for fractured hip and in a rehab facility herself. Per mother he has had some decline over last 2 weeks. Was seen in ER on 4/19 after witnessed seizure. Was evaluated, felt stable and sent back to SNF. Returns again to ER 4/20 w/ CC: cough, decreased LOC, fever, and increased work of breathing. He was hypoxic on room air; required NRB to keep sats > 92%. Initial lactic acid was > 4. PCCM asked to admit.   PAST MEDICAL HISTORY :   has a past medical history of Dementia; Dementia; Osteoporosis; and Seizures.  has no past surgical history on file. Prior to Admission medications   Medication Sig Start Date End Date Taking? Authorizing Provider  acetaminophen (TYLENOL) 500 MG tablet Take 500 mg by mouth every 4 (four) hours as needed for mild pain, fever or headache.    Yes Historical Provider, MD  alum & mag hydroxide-simeth (MAALOX/MYLANTA)  200-200-20 MG/5ML suspension Take 30 mLs by mouth every 6 (six) hours as needed for indigestion or heartburn.   Yes Historical Provider, MD  divalproex (DEPAKOTE SPRINKLE) 125 MG capsule Take 625 mg by mouth 3 (three) times daily with meals.    Yes Historical Provider, MD  guaiFENesin (ROBITUSSIN) 100 MG/5ML liquid Take 200 mg by mouth every 6 (six) hours as needed for cough.   Yes Historical Provider, MD  lactobacillus acidophilus & bulgar (LACTINEX) chewable tablet Chew 2 tablets by mouth 3 (three) times daily with meals.    Yes Historical Provider, MD  loperamide (IMODIUM) 2 MG capsule Take 2 mg by mouth as needed for diarrhea or loose stools.   Yes Historical Provider, MD  magnesium hydroxide (MILK OF MAGNESIA) 400 MG/5ML suspension Take 30 mLs by mouth at bedtime as needed for mild constipation.   Yes Historical Provider, MD  Memantine HCl ER (NAMENDA XR) 14 MG CP24 Take 14 mg by mouth daily after breakfast.    Yes Historical Provider, MD  mirtazapine (REMERON) 15 MG tablet Take 15 mg by mouth at bedtime.    Yes Historical Provider, MD  neomycin-bacitracin-polymyxin (NEOSPORIN) ointment Apply 1 application topically as needed for wound care.    Yes Historical Provider, MD  risperiDONE (RISPERDAL) 1 MG/ML oral solution Take 0.25 mg by mouth 2 (two) times daily.    Yes Historical Provider, MD  rivastigmine (EXELON) 9.5 mg/24hr Place 1 patch onto the skin daily.   Yes Historical Provider, MD  Vitamin D, Ergocalciferol, (DRISDOL) 50000 UNITS CAPS capsule Take 50,000 Units by mouth every Wednesday.    Yes Historical Provider, MD  levofloxacin (LEVAQUIN) 750 MG tablet Take 1 tablet (750 mg total) by mouth daily. X 7 days Patient not taking: Reported on 11/05/2014 08/05/14   Joycie Peek, PA-C   No Known Allergies  FAMILY HISTORY:  has no family status information on file.  SOCIAL HISTORY:  reports that he does not drink alcohol or use illicit drugs.  REVIEW OF SYSTEMS:  Unable   SUBJECTIVE:   In acute distress  VITAL SIGNS: Temp:  [98.8 F (37.1 C)-101.7 F (38.7 C)] 101.2 F (38.4 C) (04/20 1100) Pulse Rate:  [73-98] 88 (04/20 1100) Resp:  [12-24] 22 (04/20 1100) BP: (116-169)/(59-90) 120/62 mmHg (04/20 1100) SpO2:  [79 %-97 %] 95 % (04/20 1100) Weight:  [52.164 kg (115 lb)] 52.164 kg (115 lb) (04/20 1002) HEMODYNAMICS:   VENTILATOR SETTINGS:   INTAKE / OUTPUT: No intake or output data in the 24 hours ending 11/06/14 1129  PHYSICAL EXAMINATION: General:  64 year old male, critically ill appearing and in acute distress.  Neuro:  Minimally responsive, non-verbal, does not f/c  HEENT:  MM dry, Neck veins flat  Cardiovascular:  Tachy rrr  Lungs:  Diffuse course rhonchi marked accessory muscle use.  Abdomen:  Soft, non-tender + bowel sounds  Musculoskeletal:  Intact, poor muscle bulk  Skin:  Intact   LABS:  CBC  Recent Labs Lab 11/05/14 0844 11/06/14 1023  WBC 11.0* 8.1  HGB 14.4 14.7  HCT 41.4 44.4  PLT 67* PENDING   Coag's No results for input(s): APTT, INR in the last 168 hours. BMET  Recent Labs Lab 11/06/14 1023  NA 140  K 4.4  CL 100  CO2 26  BUN 23  CREATININE 1.15  GLUCOSE 136*   Electrolytes  Recent Labs Lab 11/06/14 1023  CALCIUM 8.7   Sepsis Markers  Recent Labs Lab 11/05/14 1016 11/06/14 1014  LATICACIDVEN 1.57 4.81*   ABG No results for input(s): PHART, PCO2ART, PO2ART in the last 168 hours. Liver Enzymes  Recent Labs Lab 11/06/14 1023  AST 52*  ALT 25  ALKPHOS 48  BILITOT 0.8  ALBUMIN 3.1*   Cardiac Enzymes No results for input(s): TROPONINI, PROBNP in the last 168 hours. Glucose  Recent Labs Lab 11/05/14 0837  GLUCAP 118*    Imaging Dg Chest 1 View  11/05/2014   CLINICAL DATA:  Seizure, fever  EXAM: CHEST  1 VIEW  COMPARISON:  08/05/2014  FINDINGS: Heart size and vascularity normal. Mild hyperinflation. Negative for pneumonia or heart failure. No pleural effusion  Possible 12 mm right upper lobe  density not seen on the prior study. This overlies the right anterior second rib and could be calcified costal cartilage versus a lung nodule. As this is not identified on prior studies, CT chest recommended for further evaluation.  IMPRESSION: Possible 12 mm nodule right upper lobe. Recommend CT chest with contrast for further evaluation.   Electronically Signed   By: Marlan Palau M.D.   On: 11/05/2014 09:34   Ct Head Wo Contrast  11/05/2014   CLINICAL DATA:  Seizure today  EXAM: CT HEAD WITHOUT CONTRAST  TECHNIQUE: Contiguous axial images were obtained from the base of the skull through the vertex without intravenous contrast.  COMPARISON:  CT head 08/05/2014  FINDINGS: Moderate to advanced atrophy unchanged. Chronic microvascular ischemic change in the white matter. Chronic left parietal infarct unchanged.  Negative for acute  infarct.  Negative for hemorrhage or mass lesion.  Calvarium intact.  IMPRESSION: Atrophy and chronic ischemia. No acute abnormality and no change from the prior study.   Electronically Signed   By: Marlan Palau M.D.   On: 11/05/2014 09:30     ASSESSMENT / PLAN:  PULMONARY OETT A: Acute hypoxic respiratory failure HCAP vs aspiration PNA  P:   DNR O2 NPO Would consider high flow nasal if remains hypoxic. Not BIPAP candidate   CARDIOVASCULAR CVL A:  severe sepsis  Clinically has improved w/ IVFs  P: Supportive care IVFs   RENAL A:  Lactic acidosis. Could be occult  P:   Complete IVFs load F/u lactic acid  GASTROINTESTINAL A:   Dysphagia  P:   NPO for now   HEMATOLOGIC A:  No acute but suspect hemoconcentrated.  P:  Supportive care Transfuse per usual ICU guidelines [place] heparin   INFECTIOUS A:  HCAP UTI Sepsis  P:   BCx2 4/20>>> UC 4/20>>> vanc 4/20>>> cefepime 4/20>>>  ENDOCRINE A:   No acute  P:   Trend glucose   NEUROLOGIC A:   Dementia  Seizure d/o w/ recent witnessed seizure  Acute encephalopathy: may be sepsis or  post-ictal  P:   RASS goal: 0  Supportive care  Avoid narcotics Change AEDs to IV given MS    FAMILY   - Inter-disciplinary family meet or Palliative Care meeting completed via phone re: goal of care w/ his mother Velna Hatchet SUMMARY:  80 year old demented male; now being admitted w/ severe sepsis, aspiration PNA,  hypoxic respiratory failure and lactic acidosis. He is not in shock. Lactic acid could be: occult shock, seizure related (perhaps he had unwitnessed seizure prior to admission), or r/t his respiratory failure. He has received his 30 ml/kg. Awaiting his f/u lactic acid but his BP has improved, his extremities are warm and he appears to be making urine. Have spoke w/ his 56 year old mother Guinevere Ferrari over the phone. Care limitations have been made. He will be DNR, but will cont aggressive care up to that point. She understands that he may not survive.   Simonne Martinet ACNP-BC [facility]monary/Critical Care Pager # [phone] OR # [phone] if no answer    11/06/2014, 11:29 AM

## 2014-11-06 NOTE — Progress Notes (Signed)
ED CM approached at nursing station by friends of family visiting pt and inquiring about an update on pt.  CM assessed that this male and male are not family. CM spoke with them about maintaining confidentiality and referred them to pt's mother.  Cm explained to them that Cm personally heard P Tanja PortBabcock speak with mother in detail about pt concerns.   [facility] [address], [place], [place] [id number] [phone] and spoke with Judeth CornfieldStephanie who confirms pt's mother "left ten minutes ago with a friend and is on the way to see him." CM relayed this to [facility] at [facility], EDP, Bebe ShaggyWickline, ED RN & ED CNA

## 2014-11-06 NOTE — ED Notes (Signed)
Bed: [id number] Expected date:  Expected time:  Means of arrival:  Comments: Ems-shob, ams

## 2014-11-06 NOTE — Progress Notes (Signed)
ANTIBIOTIC CONSULT NOTE - INITIAL  Pharmacy Consult for Vancomycin and Cefepime Indication: Code Sepsis, HCAP  No Known Allergies  Patient Measurements: Weight: 115 lb (52.164 kg)  Vital Signs: Temp: 101.7 F (38.7 C) (04/20 0949) Temp Source: Rectal (04/20 0949) BP: 144/67 mmHg (04/20 0949) Pulse Rate: 94 (04/20 0949) Intake/Output from previous day:   Intake/Output from this shift:    Labs:  Recent Labs  11/05/14 0844  WBC 11.0*  HGB 14.4  PLT 67*   CrCl cannot be calculated (Unknown ideal weight.). No results for input(s): VANCOTROUGH, VANCOPEAK, VANCORANDOM, GENTTROUGH, GENTPEAK, GENTRANDOM, TOBRATROUGH, TOBRAPEAK, TOBRARND, AMIKACINPEAK, AMIKACINTROU, AMIKACIN in the last 72 hours.   Microbiology: Recent Results (from the past 720 hour(s))  Culture, blood (routine x 2)     Status: None (Preliminary result)   Collection Time: 11/05/14  8:44 AM  Result Value Ref Range Status   Specimen Description BLOOD RIGHT ANTECUBITAL  Final   Special Requests BOTTLES DRAWN AEROBIC AND ANAEROBIC 5ML  Final   Culture   Final           BLOOD CULTURE RECEIVED NO GROWTH TO DATE CULTURE WILL BE HELD FOR 5 DAYS BEFORE ISSUING A FINAL NEGATIVE REPORT Performed at [facility]    Report Status PENDING  Incomplete  Culture, blood (routine x 2)     Status: None (Preliminary result)   Collection Time: 11/05/14  8:50 AM  Result Value Ref Range Status   Specimen Description BLOOD LEFT ANTECUBITAL  Final   Special Requests BOTTLES DRAWN AEROBIC AND ANAEROBIC 4ML  Final   Culture   Final           BLOOD CULTURE RECEIVED NO GROWTH TO DATE CULTURE WILL BE HELD FOR 5 DAYS BEFORE ISSUING A FINAL NEGATIVE REPORT Performed at [facility]    Report Status PENDING  Incomplete    Medical History: Past Medical History  Diagnosis Date  . Dementia   . Dementia   . Osteoporosis   . Seizures     Medications:  Scheduled:   Infusions:  . ceFEPime (MAXIPIME) IV    . sodium  chloride    . vancomycin     PRN:   Assessment: 64 yo male with hx dementia [place] [facility] [place] [facility] and [place]. Patient was seen yesterday in ED for seizure; CXR showed possible 12 mm nodule right upper lobe. CXR today (4/20) shows developing bibasilar pneumonia. Pharmacy is consulted to dose vancomycin and cefepime.  4/20 >> Vancomycin >> 4/20 >> Cefepime >>  Tmax: 101.7 WBC: 11k Renal: SCr 1.15, CrCl 47 ml/min Lactate: 4.81  4/19 blood x 2: ngtd 4/20 blood x 2: sent 4/20 urine: sent  Goal of Therapy:  Vancomycin trough level 15-20 mcg/ml  Cefepime dose per indication, renal function  Plan:   Vancomycin 1g IV in ED, then 500mg  IV q12h  Cefepime 2g IV in ED, then 1g IV q12h Follow up renal function & cultures, clinic course  Loralee PacasErin Koby Pickup, PharmD, BCPS Pager: [phone] 11/06/2014,10:05 AM

## 2014-11-06 NOTE — Progress Notes (Signed)
MEDICATION RELATED CONSULT NOTE - INITIAL   Pharmacy Consult for valproic acid Indication: History of seizures  No Known Allergies  Patient Measurements: Height: 5' 8.11" (173 cm) Weight: 115 lb (52.164 kg) IBW/kg (Calculated) : 68.65 Adjusted Body Weight:   Vital Signs: Temp: 101.8 F (38.8 C) (04/20 1204) Temp Source: Core (Comment) (04/20 1204) BP: 126/55 mmHg (04/20 1130) Pulse Rate: 96 (04/20 1130) Intake/Output from previous day:   Intake/Output from this shift: Total I/O In: 2000 [I.V.:2000] Out: -   Labs:  Recent Labs  11/05/14 0844 11/06/14 1023  WBC 11.0* 8.1  HGB 14.4 14.7  HCT 41.4 44.4  PLT 67* 109*  CREATININE  --  1.15  ALBUMIN  --  3.1*  PROT  --  7.2  AST  --  52*  ALT  --  25  ALKPHOS  --  48  BILITOT  --  0.8   Estimated Creatinine Clearance: 47.3 mL/min (by C-G formula based on Cr of 1.15).   Medical History: Past Medical History  Diagnosis Date  . Dementia   . Dementia   . Osteoporosis   . Seizures    Assessment: [age] YOM who presented 4/19 from NH with seizures, given loading dose of valproate 500mg  IV x 1 for level of 47.641mcg/ml.  Was sent back to NH and returned 4/20 with respiratory failure, sepsis, and lactic acidosis.  Pharmacy asked to dose valproate IV.  Home Depakote sprinkles 625mg  TID w/ meals  Valproate levels 4/20: 47.1 mcg/ml (previous dose was 4/19 at 17:00)  Goal of Therapy:  Valproate level 50-100 mcg/ml  Plan:   Valproic Acid 650mg  IV q8h  Consider checking trough if remains on IV >72h or change in clinical status  Juliette Alcideustin Zeigler, PharmD, BCPS.   Pager: [phone]  11/06/2014,12:08 PM

## 2014-11-06 NOTE — ED Notes (Signed)
According to critical care, pt is now DNR.

## 2014-11-06 NOTE — Progress Notes (Signed)
UR completed 

## 2014-11-07 ENCOUNTER — Inpatient Hospital Stay (HOSPITAL_COMMUNITY): Payer: PPO

## 2014-11-07 DIAGNOSIS — A419 Sepsis, unspecified organism: Principal | ICD-10-CM

## 2014-11-07 LAB — BASIC METABOLIC PANEL
ANION GAP: 5 (ref 5–15)
BUN: 18 mg/dL (ref 6–23)
CO2: 27 mmol/L (ref 19–32)
Calcium: 8.3 mg/dL — ABNORMAL LOW (ref 8.4–10.5)
Chloride: 108 mmol/L (ref 96–112)
Creatinine, Ser: 0.89 mg/dL (ref 0.50–1.35)
GFR, EST NON AFRICAN AMERICAN: 88 mL/min — AB (ref >90)
Glucose, Bld: 118 mg/dL — ABNORMAL HIGH (ref 70–99)
POTASSIUM: 3.9 mmol/L (ref 3.5–5.1)
SODIUM: 140 mmol/L (ref 135–145)

## 2014-11-07 LAB — URINE CULTURE
Colony Count: NO GROWTH
Culture: NO GROWTH

## 2014-11-07 LAB — CBC
HEMATOCRIT: 38.9 % — AB (ref 39.0–52.0)
Hemoglobin: 12.9 g/dL — ABNORMAL LOW (ref 13.0–17.0)
MCH: 32.5 pg (ref 26.0–34.0)
MCHC: 33.2 g/dL (ref 30.0–36.0)
MCV: 98 fL (ref 78.0–100.0)
Platelets: 74 10*3/uL — ABNORMAL LOW (ref 150–400)
RBC: 3.97 MIL/uL — ABNORMAL LOW (ref 4.22–5.81)
RDW: 12.9 % (ref 11.5–15.5)
WBC: 7 10*3/uL (ref 4.0–10.5)

## 2014-11-07 MED ORDER — SODIUM CHLORIDE 0.9 % IV BOLUS (SEPSIS)
500.0000 mL | Freq: Once | INTRAVENOUS | Status: AC
  Administered 2014-11-07: 500 mL via INTRAVENOUS

## 2014-11-07 MED ORDER — LIP MEDEX EX OINT
TOPICAL_OINTMENT | CUTANEOUS | Status: AC
  Filled 2014-11-07: qty 7

## 2014-11-07 MED ORDER — DEXTROSE 5 % IV SOLN
1.0000 g | Freq: Three times a day (TID) | INTRAVENOUS | Status: DC
  Administered 2014-11-07 – 2014-11-09 (×5): 1 g via INTRAVENOUS
  Filled 2014-11-07 (×6): qty 1

## 2014-11-07 MED ORDER — VANCOMYCIN HCL IN DEXTROSE 750-5 MG/150ML-% IV SOLN
750.0000 mg | Freq: Two times a day (BID) | INTRAVENOUS | Status: DC
  Administered 2014-11-07 – 2014-11-09 (×3): 750 mg via INTRAVENOUS
  Filled 2014-11-07 (×5): qty 150

## 2014-11-07 NOTE — Care Management Note (Signed)
CARE MANAGEMENT NOTE 11/07/2014  Patient:  Matthew [name],Matthew [name]   Account Number:  [id number]  Date Initiated:  11/07/2014  Documentation initiated by:  Maliha Outten  Subjective/Objective Assessment:   sepsis temp of 103.2     Action/Plan:   snf   Anticipated DC Date:  11/10/2014   Anticipated DC Plan:  SKILLED NURSING FACILITY  In-house referral  Clinical Social Worker      DC Planning Services  CM consult      [facility]  [facility]   Choice offered to / List presented to:             Status of service:  In process, will continue to follow Medicare Important Message given?   (If response is "NO", the following Medicare IM given date fields will be blank) Date Medicare IM given:   Medicare IM given by:   Date Additional Medicare IM given:   Additional Medicare IM given by:    Discharge Disposition:    Per UR Regulation:  Reviewed for med. necessity/level of care/duration of stay  If discussed at Long Length of Stay Meetings, dates discussed:    Comments:  November 07, 2014/Roe Koffman L. Earlene Plateravis, RN, BSN, CCM. Case Management [employer] [phone] No discharge needs present of time of review.

## 2014-11-07 NOTE — Progress Notes (Signed)
CSW assisting with d/c planning. [facility]. CSW has made several attempts to reach pt's mother at [facility] ( where she is receiving rehab ). A message has been left for her to contact CSW for assistance with d/c planning needs. Pt is mainly nonverbal. Brief psychosocial Assessment will be completed once pt's mother returns CSW call.  Cori RazorJamie Francesco Provencal LCSW [phone]

## 2014-11-07 NOTE — Progress Notes (Signed)
ANTIBIOTIC CONSULT NOTE - FOLLOW UP  Pharmacy Consult for Vancomycin/Cefepime Indication: HCAP/sepsis  No Known Allergies  Patient Measurements: Height: 5' 8.11" (173 cm) Weight: 141 lb 15.6 oz (64.4 kg) IBW/kg (Calculated) : 68.65  Vital Signs: Temp: 97.3 F (36.3 C) (04/21 1200) Temp Source: Core (Comment) (04/21 0400) BP: 105/52 mmHg (04/21 1200) Pulse Rate: 60 (04/21 1200) Intake/Output from previous day: 04/20 0701 - 04/21 0700 In: 5496.5 [I.V.:4690; IV Piggyback:806.5] Out: 815 [Urine:815] Intake/Output from this shift: Total I/O In: 1006.5 [I.V.:800; IV Piggyback:206.5] Out: 320 [Urine:320]  Labs:  Recent Labs  11/05/14 0844 11/06/14 1023 11/07/14 0335  WBC 11.0* 8.1 7.0  HGB 14.4 14.7 12.9*  PLT 67* 109* 74*  CREATININE  --  1.15 0.89   Estimated Creatinine Clearance: 75.4 mL/min (by C-G formula based on Cr of 0.89). No results for input(s): VANCOTROUGH, VANCOPEAK, VANCORANDOM, GENTTROUGH, GENTPEAK, GENTRANDOM, TOBRATROUGH, TOBRAPEAK, TOBRARND, AMIKACINPEAK, AMIKACINTROU, AMIKACIN in the last 72 hours.   Microbiology: Recent Results (from the past 720 hour(s))  Culture, blood (routine x 2)     Status: None (Preliminary result)   Collection Time: 11/05/14  8:44 AM  Result Value Ref Range Status   Specimen Description BLOOD RIGHT ANTECUBITAL  Final   Special Requests BOTTLES DRAWN AEROBIC AND ANAEROBIC 5ML  Final   Culture   Final           BLOOD CULTURE RECEIVED NO GROWTH TO DATE CULTURE WILL BE HELD FOR 5 DAYS BEFORE ISSUING A FINAL NEGATIVE REPORT Performed at [facility]    Report Status PENDING  Incomplete  Culture, blood (routine x 2)     Status: None (Preliminary result)   Collection Time: 11/05/14  8:50 AM  Result Value Ref Range Status   Specimen Description BLOOD LEFT ANTECUBITAL  Final   Special Requests BOTTLES DRAWN AEROBIC AND ANAEROBIC 4ML  Final   Culture   Final           BLOOD CULTURE RECEIVED NO GROWTH TO DATE CULTURE WILL  BE HELD FOR 5 DAYS BEFORE ISSUING A FINAL NEGATIVE REPORT Performed at [facility]    Report Status PENDING  Incomplete  Urine culture     Status: None   Collection Time: 11/05/14 10:03 AM  Result Value Ref Range Status   Specimen Description URINE, CATHETERIZED  Final   Special Requests NONE  Final   Colony Count NO GROWTH Performed at [facility]   Final   Culture NO GROWTH Performed at [facility]   Final   Report Status 11/06/2014 FINAL  Final  MRSA PCR Screening     Status: None   Collection Time: 11/06/14  9:41 AM  Result Value Ref Range Status   MRSA by PCR NEGATIVE NEGATIVE Final    Comment:        The GeneXpert MRSA Assay (FDA approved for NASAL specimens only), is one component of a comprehensive MRSA colonization surveillance program. It is not intended to diagnose MRSA infection nor to guide or monitor treatment for MRSA infections.   Blood Culture (routine x 2)     Status: None (Preliminary result)   Collection Time: 11/06/14 10:24 AM  Result Value Ref Range Status   Specimen Description BLOOD RIGHT ANTECUBITAL  Final   Special Requests BOTTLES DRAWN AEROBIC AND ANAEROBIC 1.5ML  Final   Culture   Final           BLOOD CULTURE RECEIVED NO GROWTH TO DATE CULTURE WILL BE HELD FOR 5  DAYS BEFORE ISSUING A FINAL NEGATIVE REPORT Performed at [facility]    Report Status PENDING  Incomplete  Blood Culture (routine x 2)     Status: None (Preliminary result)   Collection Time: 11/06/14 10:24 AM  Result Value Ref Range Status   Specimen Description BLOOD RIGHT WRIST  Final   Special Requests BOTTLES DRAWN AEROBIC AND ANAEROBIC  Final   Culture   Final           BLOOD CULTURE RECEIVED NO GROWTH TO DATE CULTURE WILL BE HELD FOR 5 DAYS BEFORE ISSUING A FINAL NEGATIVE REPORT Performed at [facility]    Report Status PENDING  Incomplete  Urine culture     Status: None   Collection Time: 11/06/14 10:39 AM  Result Value  Ref Range Status   Specimen Description IN/OUT CATH URINE  Final   Special Requests NONE  Final   Colony Count NO GROWTH Performed at [facility]   Final   Culture NO GROWTH Performed at [facility]   Final   Report Status 11/07/2014 FINAL  Final    Anti-infectives    Start     Dose/Rate Route Frequency Ordered Stop   11/07/14 0000  vancomycin (VANCOCIN) 500 mg in sodium chloride 0.9 % 100 mL IVPB     500 mg 100 mL/hr over 60 Minutes Intravenous Every 12 hours 11/06/14 1133     11/07/14 0000  ceFEPIme (MAXIPIME) 1 g in dextrose 5 % 50 mL IVPB     1 g 100 mL/hr over 30 Minutes Intravenous Every 12 hours 11/06/14 1134     11/06/14 1000  ceFEPIme (MAXIPIME) 2 g in dextrose 5 % 50 mL IVPB     2 g 100 mL/hr over 30 Minutes Intravenous  Once 11/06/14 0952 11/06/14 1107   11/06/14 1000  vancomycin (VANCOCIN) IVPB 1000 mg/200 mL premix     1,000 mg 200 mL/hr over 60 Minutes Intravenous  Once 11/06/14 1610 11/06/14 1204     Assessment 64 yo male with hx dementia presents from [place] with AMS and fever. Patient was seen yesterday in ED for seizure.  Pharmacy consulted to dose vancomycin/cefepime for sepsis/HCAP.  4/20 >> Vanc >> 4/20 >> Cefepime >>    Temp: 103.8 yesterday, afebrile since WBC: improved to wnl Renal: SCr bumped yesterday, now back to baseline; CrCl 75 CG CXR: RLL/LLL airspace opacities concerning for pneumonia  4/19 blood x2: ngtd (previous ED visit) 4/19 urine: NGF (prev ED visit) 4/20 blood x2: ngtd 4/20 urine: NGF   Goal of Therapy:   Vancomycin trough level 15-20 mcg/ml   Eradication of infection  Appropriate antibiotic dosing for indication and renal function  Plan:  Day 2 of antibiotics, renal function improved  Increase vancomycin to 750 mg IV q12 hr  Increase cefepime to 1 g IV q8 hr  Follow clinical course, renal function, culture results as available  Follow for de-escalation of antibiotics and LOT   Bernadene Person, PharmD Pager: [phone] 11/07/2014, 3:58 PM

## 2014-11-07 NOTE — Progress Notes (Signed)
PULMONARY / CRITICAL CARE MEDICINE   Name: Matthew [name] MRN: [id number] DOB: 08-14-1950    ADMISSION DATE:  11/06/2014 CONSULTATION DATE: 4/20  REFERRING MD :  Bebe Shaggy   CHIEF COMPLAINT:  Acute respiratory failure, sepsis and lactic acidosis   INITIAL PRESENTATION:  64 year old male, resides at SNF w/ advanced dementia. Seen in ER 4/19 w/ witnessed seizure. Monitored and then sent back. Admitted on 4/20 w/ working dx of: sepsis, probable aspiration PNA, possible UTI and acute respiratory failure.   STUDIES:   SIGNIFICANT EVENTS: Spoke to Matthew [name]'s only family member: Matthew [name] 462 year old mother currently in rehab after hip repair). Her contact # is [phone]. Made pt DNR w/ plans to continue aggressive but conservative medical interventions.   SUBJECTIVE:  Poorly responsive but this appears to be his baseline.  Never needed pressors, lactate improved with volume and improved resp status   VITAL SIGNS: Temp:  [99.1 F (37.3 C)-103.8 F (39.9 C)] 99.3 F (37.4 C) (04/21 0800) Pulse Rate:  [57-112] 57 (04/21 0800) Resp:  [18-34] 21 (04/21 0800) BP: (78-155)/(42-89) 101/50 mmHg (04/21 0800) SpO2:  [96 %-100 %] 98 % (04/21 0800) FiO2 (%):  [30 %-100 %] 30 % (04/21 0000) Weight:  [64.4 kg (141 lb 15.6 oz)] 64.4 kg (141 lb 15.6 oz) (04/21 0900) HEMODYNAMICS:   VENTILATOR SETTINGS: Vent Mode:  [-]  FiO2 (%):  [30 %-100 %] 30 % INTAKE / OUTPUT:  Intake/Output Summary (Last 24 hours) at 11/07/14 1114 Last data filed at 11/07/14 1000  Gross per 24 hour  Intake 5846.5 ml  Output    815 ml  Net 5031.5 ml    PHYSICAL EXAMINATION: General:  63 year old male, comfortable but poorly responsive Neuro:  Resisted movement by me, PERRL, answered to his name with single word, not following commands.  HEENT:  MM dry, Neck veins flat  Cardiovascular:  Tachy rrr  Lungs:  B basilar crackles, no wheezes.  Abdomen:  Soft, non-tender + bowel sounds  Musculoskeletal:  Intact,  poor muscle bulk  Skin:  Intact   LABS:  CBC  Recent Labs Lab 11/05/14 0844 11/06/14 1023 11/07/14 0335  WBC 11.0* 8.1 7.0  HGB 14.4 14.7 12.9*  HCT 41.4 44.4 38.9*  PLT 67* 109* 74*   Coag's No results for input(s): APTT, INR in the last 168 hours. BMET  Recent Labs Lab 11/06/14 1023 11/07/14 0335  NA 140 140  K 4.4 3.9  CL 100 108  CO2 26 27  BUN 23 18  CREATININE 1.15 0.89  GLUCOSE 136* 118*   Electrolytes  Recent Labs Lab 11/06/14 1023 11/07/14 0335  CALCIUM 8.7 8.3*   Sepsis Markers  Recent Labs Lab 11/05/14 1016 11/06/14 1014 11/06/14 1231  LATICACIDVEN 1.57 4.81* 3.4*   ABG No results for input(s): PHART, PCO2ART, PO2ART in the last 168 hours. Liver Enzymes  Recent Labs Lab 11/06/14 1023  AST 52*  ALT 25  ALKPHOS 48  BILITOT 0.8  ALBUMIN 3.1*   Cardiac Enzymes No results for input(s): TROPONINI, PROBNP in the last 168 hours. Glucose  Recent Labs Lab 11/05/14 0837  GLUCAP 118*    Imaging Dg Chest Port 1 View  11/06/2014   CLINICAL DATA:  Cough.  EXAM: PORTABLE CHEST - 1 VIEW  COMPARISON:  11/05/2014 and 08/05/2014 and 03/14/2013  FINDINGS: There is a new patchy infiltrate in the right mid and lower lung zone. There is a spiculated density at the superior aspect of the right  hilum which is worrisome for a mass.  There is also slight new accentuation of the interstitial markings at the left lung base. Heart size and pulmonary vascularity are normal.  IMPRESSION: Developing bibasilar pneumonia. Possible mass superior to the right hilum.   Electronically Signed   By: Francene BoyersJames  Maxwell M.D.   On: 11/06/2014 10:25     ASSESSMENT / PLAN:  PULMONARY A: Acute hypoxic respiratory failure HCAP vs aspiration PNA  P:   DNR confirmed O2 NPO for now. Will need to assess swallowing and determine whether he would be appropriate for comfort feeds vs PEG if he aspirates  Wean O2 as able  CARDIOVASCULAR CVL A:  severe sepsis  Clinically  has improved w/ IVFs  P: Supportive care IVFs   RENAL A:  Lactic acidosis. Improved with volume, abx and resp stabilization P:   Continue IVF  GASTROINTESTINAL A:   Dysphagia  P:   NPO for now  Will need swallow eval if he will participate  HEMATOLOGIC A:  No acute but suspect hemoconcentrated.  P:  Supportive care Transfuse per usual ICU guidelines Matthew [name] heparin   INFECTIOUS A:  HCAP UTI Sepsis  P:   BCx2 4/20>>> UC 4/20>>> vanc 4/20>>> cefepime 4/20>>>  ENDOCRINE A:   No acute  P:   Trend glucose   NEUROLOGIC A:   Dementia  Seizure d/o w/ recent witnessed seizure  Acute encephalopathy: may be sepsis or post-ictal  P:   RASS goal: 0  Supportive care  Avoid narcotics Changed AEDs to IV given MS    FAMILY   - Inter-disciplinary family meet or Palliative Care meeting completed via phone re: goal of care w/ his mother Matthew [name]    TODAY'S SUMMARY:  64 year old demented male; admitted w/ severe sepsis, aspiration PNA,  hypoxic respiratory failure and lactic acidosis.  Lactic acid could be: occult shock, seizure related (perhaps he had unwitnessed seizure prior to admission), or r/t his respiratory failure. Have spoken w/ his 66 year old mother Matthew [name] over the phone 4/20. Care limitations have been made. He is DNR, but will cont aggressive care up to that point. Will need to sort out issue of PEG feeds if he has residual dysphagia after stabilization. Will ask [facility] to assume his care 4/22.   Matthew Pupaobert Jeyden Coffelt, MD, PhD 11/07/2014, 11:53 AM [facility] [phone] or if no answer [phone]

## 2014-11-08 DIAGNOSIS — F0150 Vascular dementia without behavioral disturbance: Secondary | ICD-10-CM

## 2014-11-08 DIAGNOSIS — J690 Pneumonitis due to inhalation of food and vomit: Secondary | ICD-10-CM | POA: Diagnosis present

## 2014-11-08 DIAGNOSIS — G40909 Epilepsy, unspecified, not intractable, without status epilepticus: Secondary | ICD-10-CM

## 2014-11-08 DIAGNOSIS — J9601 Acute respiratory failure with hypoxia: Secondary | ICD-10-CM | POA: Diagnosis present

## 2014-11-08 LAB — BASIC METABOLIC PANEL
Anion gap: 7 (ref 5–15)
BUN: 15 mg/dL (ref 6–23)
CALCIUM: 8.2 mg/dL — AB (ref 8.4–10.5)
CO2: 28 mmol/L (ref 19–32)
Chloride: 105 mmol/L (ref 96–112)
Creatinine, Ser: 0.76 mg/dL (ref 0.50–1.35)
GFR calc Af Amer: >90 mL/min (ref >90)
Glucose, Bld: 96 mg/dL (ref 70–99)
POTASSIUM: 3.4 mmol/L — AB (ref 3.5–5.1)
SODIUM: 140 mmol/L (ref 135–145)

## 2014-11-08 LAB — LACTIC ACID, PLASMA: Lactic Acid, Venous: 1.5 mmol/L (ref 0.5–2.0)

## 2014-11-08 MED ORDER — POTASSIUM CHLORIDE 10 MEQ/100ML IV SOLN
10.0000 meq | INTRAVENOUS | Status: AC
Start: 2014-11-08 — End: 2014-11-08
  Administered 2014-11-08 (×4): 10 meq via INTRAVENOUS
  Filled 2014-11-08 (×4): qty 100

## 2014-11-08 NOTE — Progress Notes (Signed)
INITIAL NUTRITION ASSESSMENT  DOCUMENTATION CODES Per approved criteria  -Not Applicable   INTERVENTION: - Recommend SLP evaluation - RD to add supplements if diet advanced.   If TF considered, recommend Osmolite 1.2 @ 20 mL/hr and increase by 10 mL per hour every 4 hours to goal rate of 65 mL/hr  Goal TF rate will provide 1872 kcal (100% of estimated needs) and 87 g protein.    NUTRITION DIAGNOSIS: Inadequate oral intake related to inability to eat as evidenced by NPO.   Goal: Pt to meet >/= 90% of their estimated nutrition needs   Monitor:  Weight trend, diet advancement, SLP, labs, need for TF  Reason for Assessment: Low Braden Score  64 y.o. male  Admitting Dx: Acute respiratory failure with hypoxia  ASSESSMENT: 64 year old male who resides at a skilled nursing facility. He has advanced dementia and history of seizure disorder. He presented with sepsis and probable aspiration pneumonia. He also had acute respiratory failure. He was admitted to the intensive care unit.  - Pt is mostly non-verbal. Nutritional history obtained from chart and RN.  - Pt with dysphagia per MD note. Needs assessment for swallowing for comfort feeds vs peg. Currently NPO.  - Unsure of usual body weight or if pt has lost weight recently.  - Labs and medications reviewed  K low 3.4 - Pt with signs of mild muscle wasting at clavicle and temples.   Height: Ht Readings from Last 1 Encounters:  11/06/14 5' 8.11" (1.73 m)    Weight: Wt Readings from Last 1 Encounters:  11/08/14 143 lb 15.4 oz (65.3 kg)    Ideal Body Weight: 68.7 kg  % Ideal Body Weight: 95%  Wt Readings from Last 10 Encounters:  11/08/14 143 lb 15.4 oz (65.3 kg)  01/25/13 115 lb (52.164 kg)    Usual Body Weight: unknown  % Usual Body Weight: n/a  BMI:  Body mass index is 21.82 kg/(m^2).  Estimated Nutritional Needs: Kcal: 1750-2000 Protein: 85-95 g Fluid: 1.8-2.0 L/day  Skin: intact  Diet Order: Diet NPO  time specified  EDUCATION NEEDS: -Education not appropriate at this time   Intake/Output Summary (Last 24 hours) at 11/08/14 1336 Last data filed at 11/08/14 0800  Gross per 24 hour  Intake 2119.5 ml  Output   1055 ml  Net 1064.5 ml    Last BM: prior to admission   Labs:   Recent Labs Lab 11/06/14 1023 11/07/14 0335 11/08/14 0330  NA 140 140 140  K 4.4 3.9 3.4*  CL 100 108 105  CO2 26 27 28   BUN 23 18 15   CREATININE 1.15 0.89 0.76  CALCIUM 8.7 8.3* 8.2*  GLUCOSE 136* 118* 96    CBG (last 3)  No results for input(s): GLUCAP in the last 72 hours.  Scheduled Meds: . antiseptic oral rinse  7 mL Mouth Rinse q12n4p  . ceFEPime (MAXIPIME) IV  1 g Intravenous Q8H  . chlorhexidine  15 mL Mouth Rinse BID  . heparin  5,000 Units Subcutaneous 3 times per day  . valproate sodium  650 mg Intravenous 3 times per day  . vancomycin  750 mg Intravenous Q12H    Continuous Infusions: . dextrose 5 % and 0.45% NaCl 100 mL/hr at 11/08/14 1148    Past Medical History  Diagnosis Date  . Dementia   . Dementia   . Osteoporosis   . Seizures     History reviewed. No pertinent past surgical history.  Emmaline KluverHaley Rayanna Matusik MS, RD, LDN [phone]

## 2014-11-08 NOTE — Progress Notes (Addendum)
[facility] PROGRESS NOTE  Matthew [name]:[id number] DOB: Sep 23, 1950 DOA: 11/06/2014  PCP: Angela Cox, MD  Brief HPI: 64 year old male who resides at a skilled nursing facility. He has advanced dementia and history of seizure disorder. He presented with sepsis and probable aspiration pneumonia. He also had acute respiratory failure. He was admitted to the intensive care unit. Discussions were held with his mother and he was made a DO NOT RESUSCITATE.  Past medical history:  Past Medical History  Diagnosis Date  . Dementia   . Dementia   . Osteoporosis   . Seizures     Consultants: pulmonary and critical care medicine  Procedures: none  Antibiotics: Vancomycin and cefepime 4/20  Subjective: Patient is nonverbal. He opens his eyes briefly.  Objective: Vital Signs  Filed Vitals:   11/08/14 0900 11/08/14 1000 11/08/14 1100 11/08/14 1200  BP:    113/54  Pulse:  52    Temp: 99 F (37.2 C) 99.1 F (37.3 C) 99 F (37.2 C) 99.3 F (37.4 C)  TempSrc:      Resp: Height:      Weight:      SpO2:  99%      Intake/Output Summary (Last 24 hours) at 11/08/14 1226 Last data filed at 11/08/14 0800  Gross per 24 hour  Intake 2219.5 ml  Output   1055 ml  Net 1164.5 ml   Filed Weights   11/06/14 1002 11/07/14 0900 11/08/14 0500  Weight: 52.164 kg (115 lb) 64.4 kg (141 lb 15.6 oz) 65.3 kg (143 lb 15.4 oz)    General appearance: minimally responsive. Opens his eyes briefly. Resp: coarse breath sounds. Diminished at the bases with few crackles. No wheezing. Cardio: regular rate and rhythm, S1, S2 normal, no murmur, click, rub or gallop GI: soft, non-tender; bowel sounds normal; no masses,  no organomegaly Extremities: extremities normal, atraumatic, no cyanosis or edema Neurologic: not very responsive. Opens eyes briefly to painful stimuli.  Lab Results:  Basic Metabolic Panel:  Recent Labs Lab 11/06/14 1023 11/07/14 0335  11/08/14 0330  NA 140 140 140  K 4.4 3.9 3.4*  CL 100 108 105  CO2 GLUCOSE 136* 118* 96  BUN CREATININE 1.15 0.89 0.76  CALCIUM 8.7 8.3* 8.2*   Liver Function Tests:  Recent Labs Lab 11/06/14 1023  AST 52*  ALT 25  ALKPHOS 48  BILITOT 0.8  PROT 7.2  ALBUMIN 3.1*   CBC:  Recent Labs Lab 11/05/14 0844 11/06/14 1023 11/07/14 0335  WBC 11.0* 8.1 7.0  NEUTROABS 9.6* 6.5  --   HGB 14.4 14.7 12.9*  HCT 41.4 44.4 38.9*  MCV 97.9 97.6 98.0  PLT 67* 109* 74*   CBG:  Recent Labs Lab 11/05/14 0837  GLUCAP 118*    Recent Results (from the past 240 hour(s))  Culture, blood (routine x 2)     Status: None (Preliminary result)   Collection Time: 11/05/14  8:44 AM  Result Value Ref Range Status   Specimen Description BLOOD RIGHT ANTECUBITAL  Final   Special Requests BOTTLES DRAWN AEROBIC AND ANAEROBIC  Final   Culture   Final           BLOOD CULTURE RECEIVED NO GROWTH TO DATE CULTURE WILL BE HELD FOR 5 DAYS BEFORE ISSUING A FINAL NEGATIVE REPORT Performed at [facility]    Report Status PENDING  Incomplete  Culture, blood (routine x 2)  Status: None (Preliminary result)   Collection Time: 11/05/14  8:50 AM  Result Value Ref Range Status   Specimen Description BLOOD LEFT ANTECUBITAL  Final   Special Requests BOTTLES DRAWN AEROBIC AND ANAEROBIC 4ML  Final   Culture   Final           BLOOD CULTURE RECEIVED NO GROWTH TO DATE CULTURE WILL BE HELD FOR 5 DAYS BEFORE ISSUING A FINAL NEGATIVE REPORT Performed at [facility]    Report Status PENDING  Incomplete  Urine culture     Status: None   Collection Time: 11/05/14 10:03 AM  Result Value Ref Range Status   Specimen Description URINE, CATHETERIZED  Final   Special Requests NONE  Final   Colony Count NO GROWTH Performed at [facility]   Final   Culture NO GROWTH Performed at [facility]   Final   Report Status 11/06/2014 FINAL  Final  MRSA PCR  Screening     Status: None   Collection Time: 11/06/14  9:41 AM  Result Value Ref Range Status   MRSA by PCR NEGATIVE NEGATIVE Final    Comment:        The GeneXpert MRSA Assay (FDA approved for NASAL specimens only), is one component of a comprehensive MRSA colonization surveillance program. It is not intended to diagnose MRSA infection nor to guide or monitor treatment for MRSA infections.   Blood Culture (routine x 2)     Status: None (Preliminary result)   Collection Time: 11/06/14 10:24 AM  Result Value Ref Range Status   Specimen Description BLOOD RIGHT ANTECUBITAL  Final   Special Requests BOTTLES DRAWN AEROBIC AND ANAEROBIC 1.5ML  Final   Culture   Final           BLOOD CULTURE RECEIVED NO GROWTH TO DATE CULTURE WILL BE HELD FOR 5 DAYS BEFORE ISSUING A FINAL NEGATIVE REPORT Performed at [facility]    Report Status PENDING  Incomplete  Blood Culture (routine x 2)     Status: None (Preliminary result)   Collection Time: 11/06/14 10:24 AM  Result Value Ref Range Status   Specimen Description BLOOD RIGHT WRIST  Final   Special Requests BOTTLES DRAWN AEROBIC AND ANAEROBIC 1ML  Final   Culture   Final           BLOOD CULTURE RECEIVED NO GROWTH TO DATE CULTURE WILL BE HELD FOR 5 DAYS BEFORE ISSUING A FINAL NEGATIVE REPORT Performed at [facility]    Report Status PENDING  Incomplete  Urine culture     Status: None   Collection Time: 11/06/14 10:39 AM  Result Value Ref Range Status   Specimen Description IN/OUT CATH URINE  Final   Special Requests NONE  Final   Colony Count NO GROWTH Performed at [facility]   Final   Culture NO GROWTH Performed at [facility]   Final   Report Status 11/07/2014 FINAL  Final      Studies/Results: Dg Chest Port 1 View  11/07/2014   CLINICAL DATA:  Pneumonia  EXAM: PORTABLE CHEST - 1 VIEW  COMPARISON:  11/06/2014  FINDINGS: Increased consolidation right lower lobe. Mild left lung base opacity has  also increased. Background interstitial coarsening. Right suprahilar nodular opacity again questioned. No pleural effusion or pneumothorax. Cardiomediastinal contours within normal range. No acute osseous finding.  IMPRESSION: Right greater than left lower lobe airspace opacities, concerning for pneumonia.  Right suprahilar nodular opacity again questioned.  A  chest CT is again recommended.   Electronically Signed   By: Jearld Lesch M.D.   On: 11/07/2014 05:38    Medications:  Scheduled: . antiseptic oral rinse  7 mL Mouth Rinse q12n4p  . ceFEPime (MAXIPIME) IV  1 g Intravenous Q8H  . chlorhexidine  15 mL Mouth Rinse BID  . heparin  5,000 Units Subcutaneous 3 times per day  . potassium chloride  10 mEq Intravenous Q1 Hr x 4  . valproate sodium  650 mg Intravenous 3 times per day  . vancomycin  750 mg Intravenous Q12H   Continuous: . dextrose 5 % and 0.45% NaCl 100 mL/hr at 11/08/14 1148   [name]:[id number]  Assessment/Plan:  Principal Problem:   Acute respiratory failure with hypoxia Active Problems:   Vascular dementia, uncomplicated   Sepsis   Aspiration pneumonia    Acute respiratory failure with hypoxia Thought to be secondary to aspiration pneumonia. Patient had a seizure recently, which could've contributed. Patient was admitted to the intensive care unit and started on broad-spectrum antibiotics. CT scan of the head was unremarkable for any acute event. Discussions were held with his mother who is the next of kin. Patient was made DO NOT RESUSCITATE. His respiratory status appears to be improved. UA was noted to be abnormal, but urine cultures did not show any growth.  Probable aspiration pneumonia with severe sepsis Opacities noted in the right lower lung. Continue broad-spectrum antibiotics for now. Await culture reports. De-escalate tomorrow. Lactic acid level is improving.  Acute encephalopathy No obvious seizures noted. Encephalopathy, likely due to sepsis. Continue to  monitor closely. Keep nothing by mouth for now. Hopefully his mental status will improve in the next 24-48 hours. If it doesn't repeat imaging studies may be necessary.  History of seizure disorder Continue Depakote.  Known history of dementia Resume his home medications when able.  Hypokalemia Will be repleted intravenously.  Right suprahilar nodular opacity Incidentally noted on chest x-ray. Considering his other comorbidities, further workup likely not indicated.  DVT Prophylaxis: heparin    Code Status: DO NOT RESUSCITATE  Family Communication: no family at bedside. I spoke to the patient's guardian Roma Kayser at [phone]. He was in agreement with all of the above. Disposition Plan: can transfer to floor today. Wait for his mental status to improve. Leave nothing by mouth for now. He is from a skilled nursing facility.   LOS: 2 days   [facility]  [facility] Pager [phone] 11/08/2014, 12:26 PM  If 7PM-7AM, please contact night-coverage at [email or web address], password [facility]

## 2014-11-08 NOTE — Clinical Social Work Note (Signed)
Clinical Social Work Assessment  Patient Details  Name: Matthew [name] MRN: [id number] Date of Birth: August 15, 1950  Date of referral:  11/07/14               Reason for consult:  Discharge Planning                Permission sought to share information with:    Permission granted to share information::     Name::        Agency::     Relationship::     Contact Information:     Housing/Transportation Living arrangements for the past 2 months:  Assisted Living Facility Source of Information:  Parent, Guardian Patient Interpreter Needed:  None Criminal Activity/Legal Involvement Pertinent to Current Situation/Hospitalization:  No - Comment as needed Significant Relationships:  Parents, Other(Comment) (guardian) Lives with:  Facility Resident Do you feel safe going back to the place where you live?   (No response.) Need for family participation in patient care:  Yes (Comment) (Pt is mainly non verbal.)  Care giving concerns:  Pt may require a higher level of care at d/c.   Social Worker assessment / plan:  Pt is a 64 yr old [facility]. Pt is mainly non verbal. Pt's guardian Matthew [name]( Matthew [name] [phone]( [phone] ) and mother, Matthew BlockViola [name] [phone]( [phone] ), ALF contacted. Prior to admission pt was ambulatory and able to assist with his care with some cueing. Pt was incontinent and wore briefs. Pt would, at times, answer yes or no to questions. It's unclear, at this time, if pt will return to his former baseline. PT eval may be helpful when medically appropriate. Both guardian / mother are willing to consider [facility] Rehab if recommended. CSW will continue to follow to assist with d/c planning.  Employment status:  Disabled (Comment on whether or not currently receiving Disability) Insurance information:  Managed Medicare PT Recommendations:  Not assessed at this time Information / Referral to community resources:  Skilled Nursing Facility  Patient/Family's Response to care:   Guardian, mother are willing to consider all recommendations. D/C disposition has not yet been determined.  Patient/Family's Understanding of and Emotional Response to Diagnosis, Current Treatment, and Prognosis:  Guardian, mother understand pt's present condition and are hoping for improvement. They would like to see pt return to ALF, if possible.  Emotional Assessment Appearance:  Appears stated age Attitude/Demeanor/Rapport:  Unable to Assess Affect (typically observed):  Flat Orientation:   (unable to assess) Alcohol / Substance use:    Psych involvement (Current and /or in the community):  No (Comment)  Discharge Needs  Concerns to be addressed:  Discharge Planning Concerns Readmission within the last 30 days:  No Current discharge risk:  None Barriers to Discharge:  No Barriers Identified   Red Mandt, Dickey GaveJamie Lee, LCSW 11/08/2014, 1:08 PM

## 2014-11-08 NOTE — Progress Notes (Signed)
Pt transferred to 1334 via bed with all belongings. Family notified. Meds and chart sent to floor. Right forearm infiltrated on transfer. [facility]. [place].

## 2014-11-09 DIAGNOSIS — G9340 Encephalopathy, unspecified: Secondary | ICD-10-CM

## 2014-11-09 LAB — BASIC METABOLIC PANEL
ANION GAP: 7 (ref 5–15)
BUN: 14 mg/dL (ref 6–23)
CALCIUM: 8.5 mg/dL (ref 8.4–10.5)
CO2: 30 mmol/L (ref 19–32)
Chloride: 106 mmol/L (ref 96–112)
Creatinine, Ser: 0.82 mg/dL (ref 0.50–1.35)
GFR calc Af Amer: >90 mL/min (ref >90)
GFR calc non Af Amer: >90 mL/min (ref >90)
GLUCOSE: 123 mg/dL — AB (ref 70–99)
Potassium: 4 mmol/L (ref 3.5–5.1)
Sodium: 143 mmol/L (ref 135–145)

## 2014-11-09 LAB — CBC
HCT: 35.4 % — ABNORMAL LOW (ref 39.0–52.0)
HEMOGLOBIN: 11.5 g/dL — AB (ref 13.0–17.0)
MCH: 31.6 pg (ref 26.0–34.0)
MCHC: 32.5 g/dL (ref 30.0–36.0)
MCV: 97.3 fL (ref 78.0–100.0)
PLATELETS: 74 10*3/uL — AB (ref 150–400)
RBC: 3.64 MIL/uL — AB (ref 4.22–5.81)
RDW: 12.4 % (ref 11.5–15.5)
WBC: 6.4 10*3/uL (ref 4.0–10.5)

## 2014-11-09 MED ORDER — RIVASTIGMINE 9.5 MG/24HR TD PT24
9.5000 mg | MEDICATED_PATCH | Freq: Every day | TRANSDERMAL | Status: DC
  Administered 2014-11-09 – 2014-11-13 (×5): 9.5 mg via TRANSDERMAL
  Filled 2014-11-09 (×5): qty 1

## 2014-11-09 MED ORDER — SODIUM CHLORIDE 0.9 % IV SOLN
3.0000 g | Freq: Four times a day (QID) | INTRAVENOUS | Status: DC
  Administered 2014-11-09 – 2014-11-11 (×9): 3 g via INTRAVENOUS
  Filled 2014-11-09 (×10): qty 3

## 2014-11-09 NOTE — Evaluation (Signed)
Clinical/Bedside Swallow Evaluation Patient Details  Name: Matthew [name] MRN: [id number] Date of Birth: 1950-08-19  Today's Date: 11/09/2014 Time: SLP Start Time (ACUTE ONLY): 1504 SLP Stop Time (ACUTE ONLY): 1529 SLP Time Calculation (min) (ACUTE ONLY): 25 min  Past Medical History:  Past Medical History  Diagnosis Date  . Dementia   . Dementia   . Osteoporosis   . Seizures    Past Surgical History: History reviewed. No pertinent past surgical history. HPI:  64 yo male admitted with acute respiratory failure. Pt is from an ALF and is nonverbal at baseline per chart.    Assessment / Plan / Recommendation Clinical Impression  Pt's current mentation limits his ability to safely participate in PO intake, with frequent coughing elicited across all consistencies despite co-tx with OT to maximize safe positioning and participating in self-feeding. CXR is concerning for right greater than left bilateral basilar PNA. Recommend pt remain NPO with continued SLP f/u for PO readiness.    Aspiration Risk  Severe    Diet Recommendation NPO   Medication Administration: Via alternative means    Other  Recommendations Oral Care Recommendations: Oral care Q4 per protocol   Follow Up Recommendations   (tba)    Frequency and Duration min 3x week  1 week   Pertinent Vitals/Pain n/a    SLP Swallow Goals     Swallow Study Prior Functional Status       General HPI: 64 yo male admitted with acute respiratory failure. Pt is from an ALF and is nonverbal at baseline per chart.  Type of Study: Bedside swallow evaluation Previous Swallow Assessment: none in chart Diet Prior to this Study: NPO Temperature Spikes Noted: Yes (low grade, 99.3) Respiratory Status: Room air History of Recent Intubation: No Behavior/Cognition: Alert;Requires cueing;Distractible Oral Cavity - Dentition: Missing dentition Self-Feeding Abilities: Needs assist Patient Positioning: Other (comment) (upright  EOB) Baseline Vocal Quality: Low vocal intensity    Oral/Motor/Sensory Function     Ice Chips Ice chips: Not tested   Thin Liquid Thin Liquid: Impaired Presentation: Cup;Self Fed Pharyngeal  Phase Impairments: Suspected delayed Swallow;Cough - Delayed    Nectar Thick Nectar Thick Liquid: Impaired Presentation: Cup;Self Fed Pharyngeal Phase Impairments: Suspected delayed Swallow;Cough - Delayed   Honey Thick Honey Thick Liquid: Not tested   Puree Puree: Impaired Presentation: Self Fed;Spoon Pharyngeal Phase Impairments: Suspected delayed Swallow;Cough - Immediate;Cough - Delayed   Solid   Solid: Not tested      Matthew [name], Matthew [name] [phone]  Matthew [name], Matthew [name] 11/09/2014,3:34 PM

## 2014-11-09 NOTE — Progress Notes (Signed)
Pt. on strict I&O, per Dr. Rito EhrlichKrishnan leave foley in place.

## 2014-11-09 NOTE — Progress Notes (Signed)
MEDICATION RELATED CONSULT NOTE - Follow-up  Pharmacy Consult for valproic acid Indication: History of seizures  No Known Allergies  Patient Measurements: Height: 5' 8.11" (173 cm) Weight: 143 lb (64.864 kg) IBW/kg (Calculated) : 68.65 Adjusted Body Weight:   Vital Signs: Temp: 98.1 F (36.7 C) (04/23 0525) Temp Source: Oral (04/23 0525) BP: 110/50 mmHg (04/23 0525) Pulse Rate: 52 (04/23 0525) Intake/Output from previous day: 04/22 0701 - 04/23 0700 In: 200 [I.V.:200] Out: 1350 [Urine:1350] Intake/Output from this shift:    Labs:  Recent Labs  11/06/14 1023 11/07/14 0335 11/08/14 0330 11/09/14 0355  WBC 8.1 7.0  --  6.4  HGB 14.7 12.9*  --  11.5*  HCT 44.4 38.9*  --  35.4*  PLT 109* 74*  --  74*  CREATININE 1.15 0.89 0.76 0.82  ALBUMIN 3.1*  --   --   --   PROT 7.2  --   --   --   AST 52*  --   --   --   ALT 25  --   --   --   ALKPHOS 48  --   --   --   BILITOT 0.8  --   --   --    Estimated Creatinine Clearance: 82.4 mL/min (by C-G formula based on Cr of 0.82).   Medical History: Past Medical History  Diagnosis Date  . Dementia   . Dementia   . Osteoporosis   . Seizures    Assessment: [age] YOM who presented 4/19 from NH with seizures, given loading dose of valproate 500mg  IV x 1 for level of 47.31mcg/ml.  Was sent back to NH and returned 4/20 with respiratory failure, sepsis, and lactic acidosis.  Pharmacy asked to dose valproate IV.  Home Depakote sprinkles 625mg  TID w/ meals  Valproate levels 4/20: 47.1 mcg/ml (previous dose was 4/19 at 17:00)  Goal of Therapy:  Valproate level 50-100 mcg/ml  Plan:   Continue Valproic Acid 650mg  IV q8h  Plan checking level with am labs 4/24   Juliette Alcideustin Jalyssa Fleisher, PharmD, BCPS.   Pager: [phone]  11/09/2014,8:19 AM

## 2014-11-09 NOTE — Progress Notes (Signed)
[facility] PROGRESS NOTE  Matthew [name] ZOX:[id number] DOB: 10-10-1950 DOA: 11/06/2014  PCP: Angela Cox, MD  Brief HPI: 64 year old male who resides at a skilled nursing facility. He has advanced dementia and history of seizure disorder. He presented with sepsis and probable aspiration pneumonia. He also had acute respiratory failure. He was admitted to the intensive care unit. Discussions were held with his mother and he was made a DO NOT RESUSCITATE.  Past medical history:  Past Medical History  Diagnosis Date  . Dementia   . Dementia   . Osteoporosis   . Seizures     Consultants: pulmonary and critical care medicine  Procedures: none  Antibiotics: Vancomycin and cefepime 4/20-4/23 Unasyn 4/23  Subjective: Patient is more responsive today. Opens his eyes. Moving his extremities. Still not communicating appropriately.  Objective: Vital Signs  Filed Vitals:   11/08/14 1200 11/08/14 1520 11/08/14 2210 11/09/14 0525  BP: 113/54 116/100 119/50 110/50  Pulse:  52 51 52  Temp: 99.3 F (37.4 C)  98.1 F (36.7 C) 98.1 F (36.7 C)  TempSrc:   Oral Oral  Resp: Height:  5' 8.11" (1.73 m)    Weight:  64.864 kg (143 lb)    SpO2:  94% 97% 97%    Intake/Output Summary (Last 24 hours) at 11/09/14 0739 Last data filed at 11/09/14 0526  Gross per 24 hour  Intake    200 ml  Output   1350 ml  Net  -1150 ml   Filed Weights   11/07/14 0900 11/08/14 0500 11/08/14 1520  Weight: 64.4 kg (141 lb 15.6 oz) 65.3 kg (143 lb 15.4 oz) 64.864 kg (143 lb)    General appearance: More responsive today. Opening eyes and moving his extremities.  Resp: coarse breath sounds. Diminished at the bases with few crackles. No wheezing. Cardio: regular rate and rhythm, S1, S2 normal, no murmur, click, rub or gallop GI: soft, non-tender; bowel sounds normal; no masses,  no organomegaly Extremities: extremities normal, atraumatic, no cyanosis or edema Neurologic:  Awake. Moaning and mumbling. Moving all his extremities. Pushing me away.  Lab Results:  Basic Metabolic Panel:  Recent Labs Lab 11/06/14 1023 11/07/14 0335 11/08/14 0330 11/09/14 0355  NA 140 140 140 143  K 4.4 3.9 3.4* 4.0  CL 100 108 105 106  CO2 GLUCOSE 136* 118* 96 123*  BUN CREATININE 1.15 0.89 0.76 0.82  CALCIUM 8.7 8.3* 8.2* 8.5   Liver Function Tests:  Recent Labs Lab 11/06/14 1023  AST 52*  ALT 25  ALKPHOS 48  BILITOT 0.8  PROT 7.2  ALBUMIN 3.1*   CBC:  Recent Labs Lab 11/05/14 0844 11/06/14 1023 11/07/14 0335 11/09/14 0355  WBC 11.0* 8.1 7.0 6.4  NEUTROABS 9.6* 6.5  --   --   HGB 14.4 14.7 12.9* 11.5*  HCT 41.4 44.4 38.9* 35.4*  MCV 97.9 97.6 98.0 97.3  PLT 67* 109* 74* 74*   CBG:  Recent Labs Lab 11/05/14 0837  GLUCAP 118*    Recent Results (from the past 240 hour(s))  Culture, blood (routine x 2)     Status: None (Preliminary result)   Collection Time: 11/05/14  8:44 AM  Result Value Ref Range Status   Specimen Description BLOOD RIGHT ANTECUBITAL  Final   Special Requests BOTTLES DRAWN AEROBIC AND ANAEROBIC  Final   Culture   Final  BLOOD CULTURE RECEIVED NO GROWTH TO DATE CULTURE WILL BE HELD FOR 5 DAYS BEFORE ISSUING A FINAL NEGATIVE REPORT Performed at [facility]    Report Status PENDING  Incomplete  Culture, blood (routine x 2)     Status: None (Preliminary result)   Collection Time: 11/05/14  8:50 AM  Result Value Ref Range Status   Specimen Description BLOOD LEFT ANTECUBITAL  Final   Special Requests BOTTLES DRAWN AEROBIC AND ANAEROBIC  Final   Culture   Final           BLOOD CULTURE RECEIVED NO GROWTH TO DATE CULTURE WILL BE HELD FOR 5 DAYS BEFORE ISSUING A FINAL NEGATIVE REPORT Performed at [facility]    Report Status PENDING  Incomplete  Urine culture     Status: None   Collection Time: 11/05/14 10:03 AM  Result Value Ref Range Status   Specimen  Description URINE, CATHETERIZED  Final   Special Requests NONE  Final   Colony Count NO GROWTH Performed at [facility]   Final   Culture NO GROWTH Performed at [facility]   Final   Report Status 11/06/2014 FINAL  Final  MRSA PCR Screening     Status: None   Collection Time: 11/06/14  9:41 AM  Result Value Ref Range Status   MRSA by PCR NEGATIVE NEGATIVE Final    Comment:        The GeneXpert MRSA Assay (FDA approved for NASAL specimens only), is one component of a comprehensive MRSA colonization surveillance program. It is not intended to diagnose MRSA infection nor to guide or monitor treatment for MRSA infections.   Blood Culture (routine x 2)     Status: None (Preliminary result)   Collection Time: 11/06/14 10:24 AM  Result Value Ref Range Status   Specimen Description BLOOD RIGHT ANTECUBITAL  Final   Special Requests BOTTLES DRAWN AEROBIC AND ANAEROBIC 1.5ML  Final   Culture   Final           BLOOD CULTURE RECEIVED NO GROWTH TO DATE CULTURE WILL BE HELD FOR 5 DAYS BEFORE ISSUING A FINAL NEGATIVE REPORT Performed at [facility]    Report Status PENDING  Incomplete  Blood Culture (routine x 2)     Status: None (Preliminary result)   Collection Time: 11/06/14 10:24 AM  Result Value Ref Range Status   Specimen Description BLOOD RIGHT WRIST  Final   Special Requests BOTTLES DRAWN AEROBIC AND ANAEROBIC  Final   Culture   Final           BLOOD CULTURE RECEIVED NO GROWTH TO DATE CULTURE WILL BE HELD FOR 5 DAYS BEFORE ISSUING A FINAL NEGATIVE REPORT Performed at [facility]    Report Status PENDING  Incomplete  Urine culture     Status: None   Collection Time: 11/06/14 10:39 AM  Result Value Ref Range Status   Specimen Description IN/OUT CATH URINE  Final   Special Requests NONE  Final   Colony Count NO GROWTH Performed at [facility]   Final   Culture NO GROWTH Performed at [facility]   Final   Report  Status 11/07/2014 FINAL  Final      Studies/Results: No results found.  Medications:  Scheduled: . antiseptic oral rinse  7 mL Mouth Rinse q12n4p  . chlorhexidine  15 mL Mouth Rinse BID  . heparin  5,000 Units Subcutaneous 3 times per day  . valproate sodium  650 mg Intravenous 3 times per day   Continuous: . dextrose 5 % and 0.45% NaCl 100 mL/hr at 11/09/14 0058   UJW:[id number]:albuterol  Assessment/Plan:  Principal Problem:   Acute respiratory failure with hypoxia Active Problems:   Vascular dementia, uncomplicated   Sepsis   Aspiration pneumonia   Seizure disorder    Acute respiratory failure with hypoxia Thought to be secondary to aspiration pneumonia. Patient had a seizure recently, which could've contributed to the aspiration. Patient was initially admitted to the intensive care unit and started on broad-spectrum antibiotics. CT scan of the head was unremarkable for any acute event. Discussions were held with his mother who is the next of kin and with his guardian. Patient was made DO NOT RESUSCITATE. His respiratory status appears to be stable. UA was noted to be abnormal, but urine cultures did not show any growth.  Probable aspiration pneumonia with severe sepsis Seems to be improving slowly. Cultures have been negative so far. We will change him to intravenous Unasyn for now.   Acute encephalopathy Slowly improving. Still remains confused. No obvious seizures noted. Encephalopathy, likely due to sepsis. Speech pathologist to evaluate him and initiate diet if possible. Keep nothing by mouth for now. His mental status seems to be improving slowly. No need for repeat neuroimaging at this time.   History of seizure disorder Continue Depakote. Continue intravenously for now.  Known history of dementia Resume his home medications when able. Exelon patch has been resumed.  Hypokalemia Improved  Right suprahilar nodular opacity Incidentally noted on chest x-ray. Considering  his other comorbidities, further workup likely not indicated.  DVT Prophylaxis: heparin    Code Status: DO NOT RESUSCITATE  Family Communication: no family at bedside. Discussed with patient's guardian Matthew Kayseraylor Kallicult at [phone].  Disposition Plan: Wait for his mental status to improve. Leave nothing by mouth for now. He is from a skilled nursing facility. SLP to see.   LOS: 3 days   Wellstar Kennestone HospitalKRISHNAN,Lamarcus Spira  [facility] Pager [phone] 11/09/2014, 7:39 AM  If 7PM-7AM, please contact night-coverage at [email or web address], password [email or web address]

## 2014-11-09 NOTE — Evaluation (Signed)
Occupational Therapy Evaluation Patient Details Name: Matthew [name] MRN: [id number] DOB: 1951-01-14 Today's Date: 11/09/2014    History of Present Illness 64 yo male admitted with acute respiratory failure. Pt is from an ALF and is nonverbal at baseline per chart.    Clinical Impression   PT admitted with acute respiratory failure. Pt currently with functional limitiations due to the deficits listed below (see OT problem list). Pt from ALF and requires total (A) at this time. Pt overshooting on all task. Pt with cognitive deficits Pt will benefit from skilled OT to increase their independence and safety with adls and balance to allow discharge SNF.     Follow Up Recommendations  SNF;Supervision/Assistance - 24 hour    Equipment Recommendations  Other (comment) (defer)    Recommendations for Other Services       Precautions / Restrictions Precautions Precautions: Fall      Mobility Bed Mobility Overal bed mobility: Needs Assistance;+2 for physical assistance;+ 2 for safety/equipment Bed Mobility: Supine to Sit;Sit to Supine     Supine to sit: Max assist;HOB elevated Sit to supine: Max assist;HOB elevated   General bed mobility comments: Pt with strong posterior lean and resisting  Transfers                 General transfer comment: not attempted due to posterior lean.     Balance Overall balance assessment: Needs assistance Sitting-balance support: No upper extremity supported;Feet supported Sitting balance-Leahy Scale: Zero Sitting balance - Comments: pt progressed to min guard sitting with kyphotic postuer                                    ADL Overall ADL's : Needs assistance/impaired                                       General ADL Comments: total (A) for all adls. Pt unable to complete self feeding due to overshooting , missing mouth wtih spoon, poor visual attention to task and prematurely terminating task. pt noted  to cough after sips / bits. See SLP present during eval. Pt posterior lean at EOB initially and progressed to min guard once po intake attempts occurred     Vision Additional Comments: difficult to assess due to cognition   Perception     Praxis      Pertinent Vitals/Pain       Hand Dominance Right   Extremity/Trunk Assessment Upper Extremity Assessment Upper Extremity Assessment: Difficult to assess due to impaired cognition (overall demonstrates over shooting reaching for objects)   Lower Extremity Assessment Lower Extremity Assessment: Defer to PT evaluation   Cervical / Trunk Assessment Cervical / Trunk Assessment: Kyphotic   Communication Communication Communication: Expressive difficulties   Cognition Arousal/Alertness: Awake/alert Behavior During Therapy: Restless Overall Cognitive Status: History of cognitive impairments - at baseline                     General Comments       Exercises       Shoulder Instructions      Home Living Family/patient expects to be discharged to:: Skilled nursing facility  Prior Functioning/Environment Level of Independence: Needs assistance        Comments: unknown but from ALF    OT Diagnosis: Generalized weakness;Cognitive deficits   OT Problem List: Decreased strength;Decreased range of motion;Decreased activity tolerance;Impaired balance (sitting and/or standing);Decreased coordination;Impaired vision/perception;Decreased cognition;Decreased safety awareness;Decreased knowledge of precautions;Decreased knowledge of use of DME or AE   OT Treatment/Interventions: Self-care/ADL training;Therapeutic exercise;DME and/or AE instruction;Therapeutic activities;Cognitive remediation/compensation;Visual/perceptual remediation/compensation;Patient/family education;Balance training    OT Goals(Current goals can be found in the care plan section) Acute Rehab OT  Goals Patient Stated Goal: pt unable to state OT Goal Formulation: Patient unable to participate in goal setting Time For Goal Achievement: 11/23/14 Potential to Achieve Goals: Fair  OT Frequency: Min 2X/week   Barriers to D/C: Decreased caregiver support  from ALF and will need total (A) at current level       Co-evaluation PT/OT/SLP Co-Evaluation/Treatment: Yes Reason for Co-Treatment: For patient/therapist safety;Complexity of the patient's impairments (multi-system involvement)   OT goals addressed during session: ADL's and self-care SLP goals addressed during session: Swallowing    End of Session Nurse Communication: Mobility status;Precautions  Activity Tolerance: Patient tolerated treatment well Patient left: in bed;with call bell/phone within reach;with bed alarm set   Time: 1610-9604 OT Time Calculation (min): 25 min Charges:  OT General Charges $OT Visit: 1 Procedure OT Evaluation $Initial OT Evaluation Tier I: 1 Procedure G-Codes:    Harolyn Rutherford 12/09/2014, 3:37 PM  Pager: [phone]

## 2014-11-09 NOTE — Evaluation (Signed)
Physical Therapy Evaluation Patient Details Name: Matthew [name] MRN: [id number] DOB: 07/18/1951 Today's Date: 11/09/2014   History of Present Illness  64 yo male admitted with acute respiratory failure. Pt is from an ALF and is nonverbal at baseline per chart.   Clinical Impression  On eval, pt required Mod-Max assist for bed mobility. Attempted to stand pt at bedside however unable to with 1 person assist. Pt not following commands or initiating mobility tasks even with multimodal cueing. No family present at time of eval. Recommending SNF at this time unless ALF feels they can continue to provide pt's care.     Follow Up Recommendations SNF;Supervision/Assistance - 24 hour (unless ALF feels they can continue to provide care)    Equipment Recommendations  None recommended by PT    Recommendations for Other Services OT consult     Precautions / Restrictions Precautions Precautions: Fall Restrictions Weight Bearing Restrictions: No      Mobility  Bed Mobility Overal bed mobility: Needs Assistance Bed Mobility: Supine to Sit;Sit to Supine     Supine to sit: HOB elevated;Max assist Sit to supine: HOB elevated;Mod assist   General bed mobility comments: Increased assistance possibly needed due to pt not following commands or initiating task with multimodal cueing. Max encouragment and extensive time spent just to get pt to EOB. Assist for trunk and provided 1 hand grip assistance to get to EOB. Assist for bil LEs back onto bed.   Transfers                 General transfer comment: Attempted but pt would not assist/try to stand. Unable to get pt to standing with 1 person assist at this time.   Ambulation/Gait             General Gait Details: NT  Stairs            Wheelchair Mobility    Modified Rankin (Stroke Patients Only)       Balance Overall balance assessment: Needs assistance Sitting-balance support: No upper extremity supported;Feet  unsupported Sitting balance-Leahy Scale: Good Sitting balance - Comments: No LOb while sitting EOB                                     Pertinent Vitals/Pain Pain Assessment: Faces Faces Pain Scale: Hurts little more Pain Location: grimaces occasionally and holds onto back of neck as in discomfort Pain Descriptors / Indicators: Grimacing;Guarding Pain Intervention(s): Limited activity within patient's tolerance;Repositioned    Home Living Family/patient expects to be discharged to:: Assisted living                 Additional Comments: pt unable to provide PLOF    Prior Function Level of Independence: Needs assistance               Hand Dominance        Extremity/Trunk Assessment   Upper Extremity Assessment: Defer to OT evaluation (seems to move bil UEs without difficulty)           Lower Extremity Assessment: Difficult to assess due to impaired cognition      Cervical / Trunk Assessment: Kyphotic  Communication   Communication: Expressive difficulties  Cognition Arousal/Alertness: Awake/alert Behavior During Therapy: Restless (fidgets and grabs onto objects (gown, covers)) Overall Cognitive Status: History of cognitive impairments - at baseline  General Comments      Exercises        Assessment/Plan    PT Assessment Patient needs continued PT services  PT Diagnosis Difficulty walking;Altered mental status;Generalized weakness   PT Problem List Decreased strength;Decreased activity tolerance;Decreased balance;Decreased mobility;Decreased knowledge of use of DME;Decreased cognition;Pain  PT Treatment Interventions DME instruction;Gait training;Functional mobility training;Therapeutic activities;Therapeutic exercise;Patient/family education;Balance training   PT Goals (Current goals can be found in the Care Plan section) Acute Rehab PT Goals Patient Stated Goal: pt unable to state PT Goal Formulation:  Patient unable to participate in goal setting Time For Goal Achievement: 11/23/14 Potential to Achieve Goals: Fair    Frequency Min 3X/week   Barriers to discharge        Co-evaluation               End of Session Equipment Utilized During Treatment: Gait belt Activity Tolerance: Other (comment) (Limited by cognition and assist available) Patient left: in bed;with call bell/phone within reach;with bed alarm set           Time: [id number] PT Time Calculation (min) (ACUTE ONLY): 23 min   Charges:   PT Evaluation $Initial PT Evaluation Tier I: 1 Procedure     PT G Codes:        Rebeca AlertJannie Analiya Porco, MPT Pager: [phone]

## 2014-11-10 LAB — BASIC METABOLIC PANEL
Anion gap: 8 (ref 5–15)
BUN: 13 mg/dL (ref 6–23)
CHLORIDE: 104 mmol/L (ref 96–112)
CO2: 34 mmol/L — AB (ref 19–32)
Calcium: 8.5 mg/dL (ref 8.4–10.5)
Creatinine, Ser: 0.71 mg/dL (ref 0.50–1.35)
GFR calc Af Amer: >90 mL/min (ref >90)
GFR calc non Af Amer: >90 mL/min (ref >90)
GLUCOSE: 120 mg/dL — AB (ref 70–99)
Potassium: 3.4 mmol/L — ABNORMAL LOW (ref 3.5–5.1)
Sodium: 146 mmol/L — ABNORMAL HIGH (ref 135–145)

## 2014-11-10 LAB — CBC
HCT: 35.7 % — ABNORMAL LOW (ref 39.0–52.0)
HEMOGLOBIN: 11.9 g/dL — AB (ref 13.0–17.0)
MCH: 32.2 pg (ref 26.0–34.0)
MCHC: 33.3 g/dL (ref 30.0–36.0)
MCV: 96.7 fL (ref 78.0–100.0)
PLATELETS: 85 10*3/uL — AB (ref 150–400)
RBC: 3.69 MIL/uL — AB (ref 4.22–5.81)
RDW: 12.4 % (ref 11.5–15.5)
WBC: 5.8 10*3/uL (ref 4.0–10.5)

## 2014-11-10 LAB — VALPROIC ACID LEVEL: VALPROIC ACID LVL: 78.7 ug/mL (ref 50.0–100.0)

## 2014-11-10 MED ORDER — POTASSIUM CHLORIDE 10 MEQ/100ML IV SOLN
10.0000 meq | INTRAVENOUS | Status: AC
  Administered 2014-11-10 (×4): 10 meq via INTRAVENOUS
  Filled 2014-11-10 (×4): qty 100

## 2014-11-10 NOTE — Clinical Social Work Note (Signed)
CSW attempted to meet with pt but he was sleeping  CSW met with RN to discuss pt and Rn stated pt has only been sleeping and will not be able to communicate needs  RN stated that pt only has a mother who is involved with his care  CSW called and left message for pt's mother but did not receive a call back  Week day CSW will follow up with pt's family to assess needs.  Dede Query, LCSW [place] Worker - Weekend Coverage cell #: [phone]

## 2014-11-10 NOTE — Progress Notes (Signed)
[facility] PROGRESS NOTE  Matthew [name]:[id number] DOB: 10/10/1950 DOA: 11/06/2014  PCP: Angela Cox, MD  Brief HPI: 64 year old male who resides at a skilled nursing facility. He has advanced dementia and history of seizure disorder. He presented with sepsis and probable aspiration pneumonia. He also had acute respiratory failure. He was admitted to the intensive care unit. Discussions were held with his mother and he was made a DO NOT RESUSCITATE. He also has a guardian who agrees with the plan as well.  Past medical history:  Past Medical History  Diagnosis Date  . Dementia   . Dementia   . Osteoporosis   . Seizures     Consultants: pulmonary and critical care medicine  Procedures: none  Antibiotics: Vancomycin and cefepime 4/20-4/23 Unasyn 4/23  Subjective: Patient more awake and alert. However, he still not communicating appropriately. Not cooperative.  Objective: Vital Signs  Filed Vitals:   11/09/14 1315 11/09/14 2157 11/09/14 2231 11/10/14 0550  BP: 157/60 141/63  141/58  Pulse: 75 65  50  Temp: 97.3 F (36.3 C) 97.2 F (36.2 C)  98.2 F (36.8 C)  TempSrc: Axillary Axillary  Axillary  Resp: Height:      Weight:   63 kg (138 lb 14.2 oz)   SpO2: 99% 98%  96%    Intake/Output Summary (Last 24 hours) at 11/10/14 0801 Last data filed at 11/10/14 0550  Gross per 24 hour  Intake  856.5 ml  Output   2725 ml  Net -1868.5 ml   Filed Weights   11/08/14 0500 11/08/14 1520 11/09/14 2231  Weight: 65.3 kg (143 lb 15.4 oz) 64.864 kg (143 lb) 63 kg (138 lb 14.2 oz)    General appearance: More responsive today. Moving his extremities. Pushing me away.  Resp: coarse breath sounds. Diminished at the bases with few crackles. No wheezing. Cardio: regular rate and rhythm, S1, S2 normal, no murmur, click, rub or gallop GI: soft, non-tender; bowel sounds normal; no masses,  no organomegaly Extremities: extremities normal, atraumatic, no  cyanosis or edema Neurologic: Awake and alert. Disoriented. Moving all his extremities. Pushing me away.  Lab Results:  Basic Metabolic Panel:  Recent Labs Lab 11/06/14 1023 11/07/14 0335 11/08/14 0330 11/09/14 0355 11/10/14 0652  NA 140 140 140 143 146*  K 4.4 3.9 3.4* 4.0 3.4*  CL 100 108 105 106 104  CO2 34*  GLUCOSE 136* 118* 96 123* 120*  BUN CREATININE 1.15 0.89 0.76 0.82 0.71  CALCIUM 8.7 8.3* 8.2* 8.5 8.5   Liver Function Tests:  Recent Labs Lab 11/06/14 1023  AST 52*  ALT 25  ALKPHOS 48  BILITOT 0.8  PROT 7.2  ALBUMIN 3.1*   CBC:  Recent Labs Lab 11/05/14 0844 11/06/14 1023 11/07/14 0335 11/09/14 0355 11/10/14 0652  WBC 11.0* 8.1 7.0 6.4 5.8  NEUTROABS 9.6* 6.5  --   --   --   HGB 14.4 14.7 12.9* 11.5* 11.9*  HCT 41.4 44.4 38.9* 35.4* 35.7*  MCV 97.9 97.6 98.0 97.3 96.7  PLT 67* 109* 74* 74* 85*   CBG:  Recent Labs Lab 11/05/14 0837  GLUCAP 118*    Recent Results (from the past 240 hour(s))  Culture, blood (routine x 2)     Status: None (Preliminary result)   Collection Time: 11/05/14  8:44 AM  Result Value Ref Range Status   Specimen Description BLOOD RIGHT ANTECUBITAL  Final   Special Requests BOTTLES DRAWN AEROBIC AND ANAEROBIC 5ML  Final   Culture   Final           BLOOD CULTURE RECEIVED NO GROWTH TO DATE CULTURE WILL BE HELD FOR 5 DAYS BEFORE ISSUING A FINAL NEGATIVE REPORT Performed at [facility]    Report Status PENDING  Incomplete  Culture, blood (routine x 2)     Status: None (Preliminary result)   Collection Time: 11/05/14  8:50 AM  Result Value Ref Range Status   Specimen Description BLOOD LEFT ANTECUBITAL  Final   Special Requests BOTTLES DRAWN AEROBIC AND ANAEROBIC 4ML  Final   Culture   Final           BLOOD CULTURE RECEIVED NO GROWTH TO DATE CULTURE WILL BE HELD FOR 5 DAYS BEFORE ISSUING A FINAL NEGATIVE REPORT Performed at [facility]    Report Status PENDING   Incomplete  Urine culture     Status: None   Collection Time: 11/05/14 10:03 AM  Result Value Ref Range Status   Specimen Description URINE, CATHETERIZED  Final   Special Requests NONE  Final   Colony Count NO GROWTH Performed at [facility]   Final   Culture NO GROWTH Performed at [facility]   Final   Report Status 11/06/2014 FINAL  Final  MRSA PCR Screening     Status: None   Collection Time: 11/06/14  9:41 AM  Result Value Ref Range Status   MRSA by PCR NEGATIVE NEGATIVE Final    Comment:        The GeneXpert MRSA Assay (FDA approved for NASAL specimens only), is one component of a comprehensive MRSA colonization surveillance program. It is not intended to diagnose MRSA infection nor to guide or monitor treatment for MRSA infections.   Blood Culture (routine x 2)     Status: None (Preliminary result)   Collection Time: 11/06/14 10:24 AM  Result Value Ref Range Status   Specimen Description BLOOD RIGHT ANTECUBITAL  Final   Special Requests BOTTLES DRAWN AEROBIC AND ANAEROBIC 1.5ML  Final   Culture   Final           BLOOD CULTURE RECEIVED NO GROWTH TO DATE CULTURE WILL BE HELD FOR 5 DAYS BEFORE ISSUING A FINAL NEGATIVE REPORT Performed at [facility]    Report Status PENDING  Incomplete  Blood Culture (routine x 2)     Status: None (Preliminary result)   Collection Time: 11/06/14 10:24 AM  Result Value Ref Range Status   Specimen Description BLOOD RIGHT WRIST  Final   Special Requests BOTTLES DRAWN AEROBIC AND ANAEROBIC 1ML  Final   Culture   Final           BLOOD CULTURE RECEIVED NO GROWTH TO DATE CULTURE WILL BE HELD FOR 5 DAYS BEFORE ISSUING A FINAL NEGATIVE REPORT Performed at [facility]    Report Status PENDING  Incomplete  Urine culture     Status: None   Collection Time: 11/06/14 10:39 AM  Result Value Ref Range Status   Specimen Description IN/OUT CATH URINE  Final   Special Requests NONE  Final   Colony Count NO  GROWTH Performed at [facility]   Final   Culture NO GROWTH Performed at [facility]   Final   Report Status 11/07/2014 FINAL  Final      Studies/Results: No results found.  Medications:  Scheduled: . ampicillin-sulbactam (UNASYN) IV  3 g Intravenous Q6H  . antiseptic oral rinse  7 mL Mouth Rinse q12n4p  . chlorhexidine  15 mL Mouth Rinse BID  . heparin  5,000 Units Subcutaneous 3 times per day  . potassium chloride  10 mEq Intravenous Q1 Hr x 4  . rivastigmine  9.5 mg Transdermal Daily  . valproate sodium  650 mg Intravenous 3 times per day   Continuous: . dextrose 5 % and 0.45% NaCl 100 mL/hr at 11/09/14 1630   [name]:[id number]  Assessment/Plan:  Principal Problem:   Acute respiratory failure with hypoxia Active Problems:   Vascular dementia, uncomplicated   Sepsis   Aspiration pneumonia   Seizure disorder    Acute respiratory failure with hypoxia Resolved. Thought to be secondary to aspiration pneumonia. Patient had a seizure recently, which could've contributed to the aspiration. Patient was initially admitted to the intensive care unit and started on broad-spectrum antibiotics. CT scan of the head was unremarkable for any acute event. Discussions were held with his mother who is the next of kin and with his guardian. Patient was made DO NOT RESUSCITATE. His respiratory status appears to be stable. UA was noted to be abnormal, but urine cultures did not show any growth.  Probable aspiration pneumonia with severe sepsis Seems to be improving slowly. Cultures have been negative so far. Continue Unasyn for now. Speech pathologist to reevaluate the see if he can be started on a diet.  Acute encephalopathy Much improved. Still remains disoriented and agitated at times. According to the guardian. This is not surprising. Could be close to baseline. No obvious seizures noted. Encephalopathy, likely due to sepsis.   History of seizure disorder Continue  Depakote. Continue intravenously for now.  Known history of dementia Resume his home medications when able. Exelon patch has been resumed.  Hypokalemia Will be repleted. Check magnesium level.  Right suprahilar nodular opacity Incidentally noted on chest x-ray. Considering his other comorbidities, further workup likely not indicated. This was discussed with his guardian and he agrees.  DVT Prophylaxis: heparin    Code Status: DO NOT RESUSCITATE  Family Communication: no family at bedside. Discussed with patient's guardian Roma Kayser at [phone].  Disposition Plan: Await reevaluation by speech pathologist. His mental status appears to be close to baseline. Anticipate he may be able to go back to nursing facility within the next 1-2 days but the main determinant will be his ability to take by mouth.    LOS: 4 days   [facility]  [facility] Pager [phone] 11/10/2014, 8:01 AM  If 7PM-7AM, please contact night-coverage at [email or web address], password [facility]

## 2014-11-10 NOTE — Progress Notes (Signed)
MEDICATION RELATED CONSULT NOTE - Follow-up  Pharmacy Consult for valproic acid Indication: History of seizures  No Known Allergies  Patient Measurements: Height: 5' 8.11" (173 cm) Weight: 138 lb 14.2 oz (63 kg) IBW/kg (Calculated) : 68.65 Adjusted Body Weight:   Vital Signs: Temp: 98.2 F (36.8 C) (04/24 0550) Temp Source: Axillary (04/24 0550) BP: 141/58 mmHg (04/24 0550) Pulse Rate: 50 (04/24 0550) Intake/Output from previous day: 04/23 0701 - 04/24 0700 In: 856.5 [I.V.:600; IV Piggyback:256.5] Out: 2725 [Urine:2725] Intake/Output from this shift:    Labs:  Recent Labs  11/08/14 0330 11/09/14 0355 11/10/14 0652  WBC  --  6.4 5.8  HGB  --  11.5* 11.9*  HCT  --  35.4* 35.7*  PLT  --  74* 85*  CREATININE 0.76 0.82 0.71   Estimated Creatinine Clearance: 82 mL/min (by C-G formula based on Cr of 0.71).   Medical History: Past Medical History  Diagnosis Date  . Dementia   . Dementia   . Osteoporosis   . Seizures    Assessment: [age] YOM who presented 4/19 from NH with seizures, given loading dose of valproate 500mg  IV x 1 for level of 47.371mcg/ml.  Was sent back to NH and returned 4/20 with respiratory failure, sepsis, and lactic acidosis.  Pharmacy asked to dose valproate IV.  Home Depakote sprinkles 625mg  TID w/ meals  Valproate levels 4/20: 47.1 mcg/ml (previous dose was 4/19 at 17:00) 4/24 at 06:52 = 78.7 mcg/ml on 650mg m IV q8h  Goal of Therapy:  Valproate level 50-100 mcg/ml  Plan:   Continue Valproic Acid 650mg  IV q8h as level therapeutic 4/24 am  Await ability to change back to PO  Juliette Alcideustin Zeigler, PharmD, BCPS.   Pager: [phone]  11/10/2014,9:42 AM

## 2014-11-10 NOTE — Progress Notes (Signed)
Speech Language Pathology Treatment: Dysphagia  Patient Details Name: Matthew [name] MRN: [id number] DOB: [birth year] Today's Date: 11/10/2014 Time: [id number] SLP Time Calculation (min) (ACUTE ONLY): 13 min  Assessment / Plan / Recommendation Clinical Impression  Pt with much improved attention and readiness for swallowing today.  He required careful hand-feeding and verbal cues re: the approach of cup/spoon to his mouth to avoid surprise.  When his eyes were open, and when he could see material approaching, he was able to anticipate and orally manipulate purees and thin liquids appropriately.  Swallow was consistent with no coughing. Pt occasionally spoke, answering "yes" when asked if he wanted more.  He consumed 4 oz of applesauce and 3 oz of water from side of cup and using straw with no overt s/s of aspiration. Cracker was offered - pt did not appear to recognize material and it was immediately released from his mouth onto gown.   Intermittent facial grimacing was evident during session, as well as brief bilateral tremors of hands and lower jaw; unclear if related to pain. Recommend initiating a dysphagia 1 diet with thin liquids; may use straw.  Pt requires gentle hand-feeding and simple verbal cues.  His behavior may be inconsistent re: ability to recognize POs.  SLP will follow for safety/education.   HPI HPI: 64 yo male admitted with acute respiratory failure. Pt is from an ALF and is nonverbal at baseline per chart.    Pertinent Vitals Pain Assessment: Faces Faces Pain Scale: Hurts a little bit  SLP Plan  Continue with current plan of care    Recommendations Diet recommendations: Thin liquid;Dysphagia 1 (puree) Liquids provided via: Teaspoon;Cup Medication Administration: Crushed with puree Supervision: Trained caregiver to feed patient Postural Changes and/or Swallow Maneuvers: Seated upright 90 degrees              Oral Care Recommendations: Oral care BID Follow up  Recommendations:  (tba) Plan: Continue with current plan of care      Elissia Spiewak L. Samson Fredericouture, [place] CCC/SLP Pager [phone]  Blenda MountsCouture, Zondra Lawlor Laurice 11/10/2014, 2:56 PM

## 2014-11-11 LAB — MAGNESIUM: MAGNESIUM: 1.8 mg/dL (ref 1.5–2.5)

## 2014-11-11 LAB — CULTURE, BLOOD (ROUTINE X 2)
CULTURE: NO GROWTH
CULTURE: NO GROWTH

## 2014-11-11 LAB — BASIC METABOLIC PANEL
Anion gap: 9 (ref 5–15)
BUN: 14 mg/dL (ref 6–23)
CO2: 32 mmol/L (ref 19–32)
CREATININE: 0.76 mg/dL (ref 0.50–1.35)
Calcium: 8.3 mg/dL — ABNORMAL LOW (ref 8.4–10.5)
Chloride: 102 mmol/L (ref 96–112)
GFR calc Af Amer: >90 mL/min (ref >90)
GLUCOSE: 133 mg/dL — AB (ref 70–99)
Potassium: 4.2 mmol/L (ref 3.5–5.1)
Sodium: 143 mmol/L (ref 135–145)

## 2014-11-11 LAB — CBC
HEMATOCRIT: 38.5 % — AB (ref 39.0–52.0)
HEMOGLOBIN: 12.9 g/dL — AB (ref 13.0–17.0)
MCH: 32.2 pg (ref 26.0–34.0)
MCHC: 33.5 g/dL (ref 30.0–36.0)
MCV: 96 fL (ref 78.0–100.0)
PLATELETS: 89 10*3/uL — AB (ref 150–400)
RBC: 4.01 MIL/uL — ABNORMAL LOW (ref 4.22–5.81)
RDW: 12.5 % (ref 11.5–15.5)
WBC: 6 10*3/uL (ref 4.0–10.5)

## 2014-11-11 MED ORDER — MIRTAZAPINE 15 MG PO TABS
15.0000 mg | ORAL_TABLET | Freq: Every day | ORAL | Status: DC
  Administered 2014-11-12 – 2014-11-13 (×2): 15 mg via ORAL
  Filled 2014-11-11 (×4): qty 1

## 2014-11-11 MED ORDER — DIVALPROEX SODIUM 125 MG PO CPSP
625.0000 mg | ORAL_CAPSULE | Freq: Three times a day (TID) | ORAL | Status: DC
  Administered 2014-11-11 – 2014-11-14 (×8): 625 mg via ORAL
  Filled 2014-11-11 (×12): qty 5

## 2014-11-11 MED ORDER — ENSURE ENLIVE PO LIQD
237.0000 mL | Freq: Two times a day (BID) | ORAL | Status: DC
  Administered 2014-11-11: 237 mL via ORAL

## 2014-11-11 MED ORDER — AMOXICILLIN-POT CLAVULANATE 875-125 MG PO TABS
1.0000 | ORAL_TABLET | Freq: Two times a day (BID) | ORAL | Status: DC
  Administered 2014-11-11: 1 via ORAL
  Filled 2014-11-11 (×4): qty 1

## 2014-11-11 MED ORDER — MEMANTINE HCL ER 14 MG PO CP24
14.0000 mg | ORAL_CAPSULE | Freq: Every day | ORAL | Status: DC
  Administered 2014-11-11 – 2014-11-13 (×3): 14 mg via ORAL
  Filled 2014-11-11 (×4): qty 1

## 2014-11-11 MED ORDER — RISPERIDONE 1 MG/ML PO SOLN
0.2500 mg | Freq: Two times a day (BID) | ORAL | Status: DC
  Administered 2014-11-11 – 2014-11-14 (×7): 0.25 mg via ORAL
  Filled 2014-11-11 (×8): qty 0.25

## 2014-11-11 NOTE — Progress Notes (Signed)
CSW continuing to follow.  [facility]   PT notes recommending SNF level of care at this time.  CSW contacted pt mother, Belinda BlockViola Mavity via telephone, but unable to reach her at this time. CSW contacted the facility that pt mother is at for short term rehab and provided this CSW contact information in order to be provided to pt mother and pt mother can contact this CSW.   CSW contacted pt guardian, Marnee Guarneriaylor Kallicut via telephone. CSW introduced self and explained role. Pt guardian discussed that pt mother is involved in pt care and pt guardian allows for pt mother input about decisions for pt. Pt guardian stated that pt was ambulatory with limited assistance [facility] and could feed himself. CSW discussed PT evaluation and treatment notes and pt guardian states that requiring a lot of assistance is not baseline for pt. Pt guardian agrees that pt needs higher level of care at this time. Pt [place] [facility] [place] [facility] [place] facilities.   CSW updated pt FL2 and initiated SN[place] [facility] [place].   CSW to follow up with pt guardian, Marnee Guarneriaylor K[place] to provide SNF bed offers. CSW to discuss with pt mother once CSW is able to reach pt mother to discuss.   CSW to continue to follow to provide support and assist with pt discharge planning needs.  Loletta SpecterSuzanna Giuliana Handyside, MSW, LCSW Clinical Social Work [phone]

## 2014-11-11 NOTE — Progress Notes (Signed)
Matthew HuaDavid took a full bottle of Ensure chocolate (240 mL) and is awaiting lunch. Once he got started, he drank readily.

## 2014-11-11 NOTE — Clinical Social Work Placement (Signed)
   CLINICAL SOCIAL WORK PLACEMENT  NOTE  Date:  11/11/2014  Patient Details  Name: Matthew [name] MRN: [id number] Date of Birth: [birth year]  Clinical Social Work is seeking post-discharge placement for this patient at the Skilled  Nursing Facility level of care (*CSW will initial, date and re-position this form in  chart as items are completed):  Yes   Patient/family provided with [facility] Social Work Department's list of facilities offering this level of care within the geographic area requested by the patient (or if unable, by the patient's family).  Yes   Patient/family informed of their freedom to choose among providers that offer the needed level of care, that participate in Medicare, Medicaid or managed care program needed by the patient, have an available bed and are willing to accept the patient.  Yes   Patient/family informed of [facility]'s ownership interest in [facility] and [facility], as well as of the fact that they are under no obligation to receive care at these facilities.  PASRR submitted to EDS on 11/11/14     PASRR number received on 11/11/14     Existing PASRR number confirmed on       FL2 transmitted to all facilities in geographic area requested by pt/family on 11/11/14 ([place] SN[place])     FL2 transmitted to all facilities within larger geographic area on       Patient informed that his/her managed care company has contracts with or will negotiate with certain facilities, including the following:            Patient/family informed of bed offers received.  Patient chooses bed at       Physician recommends and patient chooses bed at      Patient to be transferred to   on  .  Patient to be transferred to facility by       Patient family notified on   of transfer.  Name of family member notified:        PHYSICIAN Please sign FL2, Please sign DNR     Additional Comment:     _______________________________________________ Orson EvaKIDD, SUZANNA A, LCSW 11/11/2014, 2:11 PM

## 2014-11-11 NOTE — Progress Notes (Signed)
Speech Language Pathology Treatment: Dysphagia  Patient Details Name: Matthew [name] MRN: [id number] DOB: [birth year] Today's Date: 11/11/2014 Time: 1520-1530 SLP Time Calculation (min) (ACUTE ONLY): 10 min  Assessment / Plan / Recommendation Clinical Impression  SLP spoke to RN who reports pt consumed approx 40% of dinner last evening (pt's mother fed pt) and 20% of breakfast today.   SLP follow up to determine tolerance of po diet/? Dietary advancement and family education. No family present today unfortunately.    SLP observed RN give pt medicine crushed with Ensure pudding using caution/care.  Pt with delayed swallows and as RN stated became more accepting of po as "snack" progressed.  No indications of airway compromise noted.    Pt did not accept po intake offered by SLP including apple juice or further boluses of Ensure pudding despite slow cueing and tactile stimulation.  As to not agitate pt, SLP ceased offering intake and lowered his HOB to allow him to rest.   Suspect intake of liquids will be more efficient for pt to consume *Ensure liquids, etc.  Recommend continue current diet with aspiration precautions = encouraging pt to eat as he is willing.    Recommend brief follow up at SNF for family education re: dysphagia associated with Alzheimer's disease.    HPI HPI: 64 yo male admitted with acute respiratory failure. Pt is from an ALF and is nonverbal at baseline per chart. Pt underwent BSE Sat and follow up yesterday with recommendation to advance diet to puree/thin.  Today follow up indicated to determine appropriateness of diet/diet advancement and for family ed.     Pertinent Vitals Pain Assessment: Faces Faces Pain Scale: Hurts even more Pain Location: grimaced occasionally during session = x3 total = unable to identify if pain related- pt appeared comfortable upon session end Pain Descriptors / Indicators: Grimacing Pain Intervention(s): Limited activity within patient's  tolerance  SLP Plan  Continue with current plan of care    Recommendations Diet recommendations: Dysphagia 1 (puree);Thin liquid Liquids provided via: Cup;Teaspoon Medication Administration: Crushed with puree Supervision: Full supervision/cueing for compensatory strategies Compensations: Small sips/bites;Slow rate (observe swallow *laryngeal elevation) Postural Changes and/or Swallow Maneuvers: Seated upright 90 degrees;Upright 30-60 min after meal (remain upright after meals to allow clearance of possible oropharyngeal residuals, pt was resistant to oral cavity check by SlP)              Oral Care Recommendations: Oral care BID Follow up Recommendations: Skilled Nursing facility (briefly for family education) Plan: Continue with current plan of care    GO     Matthew Burnetamara Isyss Espinal, MS [facility] SLP [phone]

## 2014-11-11 NOTE — Progress Notes (Signed)
[facility] PROGRESS NOTE  Matthew [name] ZOX:[id number] DOB: [birth year] DOA: 11/06/2014  PCP: Angela Cox, MD  Brief HPI: 64 year old male who resides at a skilled nursing facility. He has advanced dementia and history of seizure disorder. He presented with sepsis and probable aspiration pneumonia. He also had acute respiratory failure. He was admitted to the intensive care unit. Discussions were held with his mother and he was made a DO NOT RESUSCITATE. He also has a guardian.  Past medical history:  Past Medical History  Diagnosis Date  . Dementia   . Dementia   . Osteoporosis   . Seizures     Consultants: pulmonary and critical care medicine  Procedures: none  Antibiotics: Vancomycin and cefepime 4/20-4/23 Unasyn 4/23  Subjective: Patient easily arousable. But doesn't communicate appropriately. Not cooperative. No family at bedside.  Objective: Vital Signs  Filed Vitals:   11/10/14 1325 11/10/14 2030 11/11/14 0245 11/11/14 0414  BP: 168/72 155/68  162/71  Pulse: 60 61  60  Temp: 97.4 F (36.3 C) 98.8 F (37.1 C)  98.8 F (37.1 C)  TempSrc: Axillary Axillary  Axillary  Resp: Height:      Weight:   57.1 kg (125 lb 14.1 oz)   SpO2: 95% 90%  97%    Intake/Output Summary (Last 24 hours) at 11/11/14 0754 Last data filed at 11/11/14 0415  Gross per 24 hour  Intake      0 ml  Output   1075 ml  Net  -1075 ml   Filed Weights   11/08/14 1520 11/09/14 2231 11/11/14 0245  Weight: 64.864 kg (143 lb) 63 kg (138 lb 14.2 oz) 57.1 kg (125 lb 14.1 oz)    General appearance: Continues to be more responsive and less agitated. Not fully cooperative.  Resp: coarse breath sounds. Diminished at the bases with few crackles. No wheezing. Cardio: regular rate and rhythm, S1, S2 normal, no murmur, click, rub or gallop GI: soft, non-tender; bowel sounds normal; no masses,  no organomegaly Extremities: extremities normal, atraumatic, no cyanosis or  edema Neurologic: Somnolent but arousable. Disoriented. No changes compared to yesterday.  Lab Results:  Basic Metabolic Panel:  Recent Labs Lab 11/07/14 0335 11/08/14 0330 11/09/14 0355 11/10/14 0652 11/11/14 0430  NA 140 140 143 146* 143  K 3.9 3.4* 4.0 3.4* 4.2  CL 108 105 106 104 102  CO2 34* 32  GLUCOSE 118* 96 123* 120* 133*  BUN CREATININE 0.89 0.76 0.82 0.71 0.76  CALCIUM 8.3* 8.2* 8.5 8.5 8.3*  MG  --   --   --   --  1.8   Liver Function Tests:  Recent Labs Lab 11/06/14 1023  AST 52*  ALT 25  ALKPHOS 48  BILITOT 0.8  PROT 7.2  ALBUMIN 3.1*   CBC:  Recent Labs Lab 11/05/14 0844 11/06/14 1023 11/07/14 0335 11/09/14 0355 11/10/14 0652 11/11/14 0430  WBC 11.0* 8.1 7.0 6.4 5.8 6.0  NEUTROABS 9.6* 6.5  --   --   --   --   HGB 14.4 14.7 12.9* 11.5* 11.9* 12.9*  HCT 41.4 44.4 38.9* 35.4* 35.7* 38.5*  MCV 97.9 97.6 98.0 97.3 96.7 96.0  PLT 67* 109* 74* 74* 85* 89*   CBG:  Recent Labs Lab 11/05/14 0837  GLUCAP 118*    Recent Results (from the past 240 hour(s))  Culture, blood (routine x 2)     Status: None (Preliminary  result)   Collection Time: 11/05/14  8:44 AM  Result Value Ref Range Status   Specimen Description BLOOD RIGHT ANTECUBITAL  Final   Special Requests BOTTLES DRAWN AEROBIC AND ANAEROBIC 5ML  Final   Culture   Final           BLOOD CULTURE RECEIVED NO GROWTH TO DATE CULTURE WILL BE HELD FOR 5 DAYS BEFORE ISSUING A FINAL NEGATIVE REPORT Performed at [facility]    Report Status PENDING  Incomplete  Culture, blood (routine x 2)     Status: None (Preliminary result)   Collection Time: 11/05/14  8:50 AM  Result Value Ref Range Status   Specimen Description BLOOD LEFT ANTECUBITAL  Final   Special Requests BOTTLES DRAWN AEROBIC AND ANAEROBIC 4ML  Final   Culture   Final           BLOOD CULTURE RECEIVED NO GROWTH TO DATE CULTURE WILL BE HELD FOR 5 DAYS BEFORE ISSUING A FINAL NEGATIVE REPORT Performed  at [facility]    Report Status PENDING  Incomplete  Urine culture     Status: None   Collection Time: 11/05/14 10:03 AM  Result Value Ref Range Status   Specimen Description URINE, CATHETERIZED  Final   Special Requests NONE  Final   Colony Count NO GROWTH Performed at [facility]   Final   Culture NO GROWTH Performed at [facility]   Final   Report Status 11/06/2014 FINAL  Final  MRSA PCR Screening     Status: None   Collection Time: 11/06/14  9:41 AM  Result Value Ref Range Status   MRSA by PCR NEGATIVE NEGATIVE Final    Comment:        The GeneXpert MRSA Assay (FDA approved for NASAL specimens only), is one component of a comprehensive MRSA colonization surveillance program. It is not intended to diagnose MRSA infection nor to guide or monitor treatment for MRSA infections.   Blood Culture (routine x 2)     Status: None (Preliminary result)   Collection Time: 11/06/14 10:24 AM  Result Value Ref Range Status   Specimen Description BLOOD RIGHT ANTECUBITAL  Final   Special Requests BOTTLES DRAWN AEROBIC AND ANAEROBIC 1.5ML  Final   Culture   Final           BLOOD CULTURE RECEIVED NO GROWTH TO DATE CULTURE WILL BE HELD FOR 5 DAYS BEFORE ISSUING A FINAL NEGATIVE REPORT Performed at [facility]    Report Status PENDING  Incomplete  Blood Culture (routine x 2)     Status: None (Preliminary result)   Collection Time: 11/06/14 10:24 AM  Result Value Ref Range Status   Specimen Description BLOOD RIGHT WRIST  Final   Special Requests BOTTLES DRAWN AEROBIC AND ANAEROBIC 1ML  Final   Culture   Final           BLOOD CULTURE RECEIVED NO GROWTH TO DATE CULTURE WILL BE HELD FOR 5 DAYS BEFORE ISSUING A FINAL NEGATIVE REPORT Performed at [facility]    Report Status PENDING  Incomplete  Urine culture     Status: None   Collection Time: 11/06/14 10:39 AM  Result Value Ref Range Status   Specimen Description IN/OUT CATH URINE  Final    Special Requests NONE  Final   Colony Count NO GROWTH Performed at [facility]   Final   Culture NO GROWTH Performed at [facility]   Final   Report  Status 11/07/2014 FINAL  Final      Studies/Results: No results found.  Medications:  Scheduled: . ampicillin-sulbactam (UNASYN) IV  3 g Intravenous Q6H  . antiseptic oral rinse  7 mL Mouth Rinse q12n4p  . chlorhexidine  15 mL Mouth Rinse BID  . heparin  5,000 Units Subcutaneous 3 times per day  . memantine  14 mg Oral QPC breakfast  . mirtazapine  15 mg Oral QHS  . risperiDONE  0.25 mg Oral BID  . rivastigmine  9.5 mg Transdermal Daily  . valproate sodium  650 mg Intravenous 3 times per day   Continuous: . dextrose 5 % and 0.45% NaCl 100 mL/hr at 11/10/14 0850   ZOX:[id number]  Assessment/Plan:  Principal Problem:   Acute respiratory failure with hypoxia Active Problems:   Vascular dementia, uncomplicated   Sepsis   Aspiration pneumonia   Seizure disorder    Acute respiratory failure with hypoxia Resolved. Thought to be secondary to aspiration pneumonia. Patient had a seizure recently, which could've contributed to the aspiration. Patient was initially admitted to the intensive care unit and started on broad-spectrum antibiotics. CT scan of the head was unremarkable for any acute event. Discussions were held with his mother who is the next of kin and with his guardian. Patient was made DO NOT RESUSCITATE. His respiratory status appears to be stable. UA was noted to be abnormal, but urine cultures did not show any growth.  Probable aspiration pneumonia with severe sepsis Seems to be improving slowly. Cultures have been negative so far. Continue Unasyn for now. After swallow evaluation. He has been started on a dysphagia 1 diet. His oral intake remains poor.  Acute encephalopathy Improved but still remains disoriented and agitated at times. According to the guardian this is not surprising. Could be close  to baseline. No obvious seizures noted. Encephalopathy, likely due to sepsis. Wait and see if he is able to eat appropriately. If he doesn't improve enough in the next day or so and starts eating, then we may have to pursue this issue with his mother and guardian.  History of seizure disorder Continue Depakote. Continue intravenously for now. Change to oral once he is able to take orally.  Known history of dementia Continue Namenda and Exelon patch. Risperdal as well.  Hypokalemia Repleted. Magnesium was normal.   Thrombocytopenia Likely due to sepsis. Counts are stable. No evidence for bleeding.  Right suprahilar nodular opacity Incidentally noted on chest x-ray. Considering his other comorbidities, further workup likely not indicated. This was discussed with his guardian and he agrees.  DVT Prophylaxis: heparin    Code Status: DO NOT RESUSCITATE  Family Communication: no family at bedside. Attempted to call patient's guardian Matthew [name] at [phone].  Disposition Plan: Mental status is improving though not sure if he is back to baseline or not. He also needs to start eating. Anticipate he may be able to go back to nursing facility within the next 1-2 days but the main determinant will be his ability to take by mouth.    LOS: 5 days   [facility]  [facility] Pager [phone] 11/11/2014, 7:54 AM  If 7PM-7AM, please contact night-coverage at [email or web address], password [facility]

## 2014-11-11 NOTE — Progress Notes (Signed)
Physical Therapy Treatment Patient Details Name: Matthew SidleDavid R [name] MRN: [id number] DOB: Mar 15, 1951 Today's Date: 11/11/2014    History of Present Illness 64 yo male admitted with acute respiratory failure. Pt is from an ALF and is nonverbal at baseline per chart.     PT Comments    Patient's R ankle appears  Edematous and tender to touch when lifting legs from bed. Patient repeated 'No, Don't" several times. Patient is unable to follow commands, poor sitting balance today. Patient's level of function not known at this time. MSW to speak with mother about prior level of function.  Follow Up Recommendations  SNF;Supervision/Assistance - 24 hour     Equipment Recommendations  None recommended by PT    Recommendations for Other Services       Precautions / Restrictions Precautions Precautions: Fall    Mobility  Bed Mobility   Bed Mobility: Supine to Sit;Sit to Supine     Supine to sit: HOB elevated;+2 for safety/equipment;+2 for physical assistance;Max assist Sit to supine: HOB elevated;+2 for safety/equipment;+2 for physical assistance;Max assist   General bed mobility comments: Pt with strong posterior lean and resisting  Transfers                 General transfer comment: not attempted due to posterior lean.   Ambulation/Gait                 Stairs            Wheelchair Mobility    Modified Rankin (Stroke Patients Only)       Balance     Sitting balance-Leahy Scale: Zero Sitting balance - Comments: did not gain  trunk control Postural control: Posterior lean                          Cognition Arousal/Alertness: Awake/alert Behavior During Therapy: Restless Overall Cognitive Status: History of cognitive impairments - at baseline                      Exercises      General Comments        Pertinent Vitals/Pain Pain Assessment: Faces Faces Pain Scale: Hurts even more Pain Location: did  verbalize, "no don't"  when lifted R leg from bed. also holds onto back of his nec when sitting up. Pain Descriptors / Indicators: Grimacing;Guarding    Home Living                      Prior Function            PT Goals (current goals can now be found in the care plan section) Progress towards PT goals: Progressing toward goals    Frequency  Min 2X/week    PT Plan Frequency needs to be updated    Co-evaluation             End of Session   Activity Tolerance: Other (comment) (limited by inability to follow, poor balance) Patient left: in bed;with call bell/phone within reach;with bed alarm set     Time: [id number] PT Time Calculation (min) (ACUTE ONLY): 14 min  Charges:  $Therapeutic Activity: 8-22 mins                    G Codes:      Rada HayHill, Michaelpaul Apo Elizabeth 11/11/2014, 1:13 PM Blanchard KelchKaren Sani Madariaga PT [phone]

## 2014-11-12 DIAGNOSIS — R1310 Dysphagia, unspecified: Secondary | ICD-10-CM

## 2014-11-12 LAB — CULTURE, BLOOD (ROUTINE X 2)
CULTURE: NO GROWTH
Culture: NO GROWTH

## 2014-11-12 MED ORDER — ENSURE ENLIVE PO LIQD: 237.0000 mL | Freq: Two times a day (BID) | ORAL | Status: AC

## 2014-11-12 MED ORDER — AMOXICILLIN-POT CLAVULANATE 875-125 MG PO TABS: 1.0000 | ORAL_TABLET | Freq: Two times a day (BID) | ORAL | Status: DC

## 2014-11-12 MED ORDER — ALBUTEROL SULFATE (2.5 MG/3ML) 0.083% IN NEBU: 2.5000 mg | INHALATION_SOLUTION | RESPIRATORY_TRACT | Status: AC | PRN

## 2014-11-12 MED ORDER — AMPICILLIN-SULBACTAM SODIUM 3 (2-1) G IJ SOLR
3.0000 g | Freq: Four times a day (QID) | INTRAMUSCULAR | Status: DC
  Administered 2014-11-12 – 2014-11-13 (×6): 3 g via INTRAVENOUS
  Filled 2014-11-12 (×8): qty 3

## 2014-11-12 NOTE — Discharge Instructions (Signed)
Aspiration Pneumonia °Aspiration pneumonia is an infection in your lungs. It occurs when food, liquid, or stomach contents (vomit) are inhaled (aspirated) into your lungs. When these things get into your lungs, swelling (inflammation) and infection can occur. This can make it difficult for you to breathe. Aspiration pneumonia is a serious condition and can be life threatening. °RISK FACTORS °Aspiration pneumonia is more likely to occur when a person's cough (gag) reflex or ability to swallow has been decreased. Some things that can do this include:  °· Having a brain injury or disease, such as stroke, seizures, Parkinson's disease, dementia, or amyotrophic lateral sclerosis (ALS).   °· Being given general anesthetic for procedures.   °· Being in a coma (unconscious).   °· Having a narrowing of the tube that carries food to the stomach (esophagus).   °· Drinking too much alcohol. If a person passes out and vomits, vomit can be swallowed into the lungs.   °· Taking certain medicines, such as tranquilizers or sedatives.   °SIGNS AND SYMPTOMS  °· Coughing after swallowing food or liquids.   °· Breathing problems, such as wheezing or shortness of breath.   °· Bluish skin. This can be caused by lack of oxygen.   °· Coughing up food or mucus. The mucus might contain blood, greenish material, or yellowish-white fluid (pus).   °· Fever.   °· Chest pain.   °· Being more tired than usual (fatigue).   °· Sweating more than usual.   °· Bad breath.   °DIAGNOSIS  °A physical exam will be done. During the exam, the health care provider will listen to your lungs with a stethoscope to check for:  °· Crackling sounds in the lungs. °· Decreased breath sounds. °· A rapid heartbeat. °Various tests may be ordered. These may include:  °· Chest X-ray.   °· CT scan.   °· Swallowing study. This test looks at how food is swallowed and whether it goes into your breathing tube (trachea) or food pipe (esophagus).   °· Sputum culture. Saliva and  mucus (sputum) are collected from the lungs or the tubes that carry air to the lungs (bronchi). The sputum is then tested for bacteria.   °· Bronchoscopy. This test uses a flexible tube (bronchoscope) to see inside the lungs. °TREATMENT  °Treatment will usually include antibiotic medicines. Other medicines may also be used to reduce fever or pain. You may need to be treated in the hospital. In the hospital, your breathing will be carefully monitored. Depending on how well you are breathing, you may need to be given oxygen, or you may need breathing support from a breathing machine (ventilator). For people who fail a swallowing study, a feeding tube might be placed in the stomach, or they may be asked to avoid certain food textures or liquids when they eat. °HOME CARE INSTRUCTIONS  °· Carefully follow any special eating instructions you were given, such as avoiding certain food textures or thickening liquids. This reduces the risk of developing aspiration pneumonia again.  °· Only take over-the-counter or prescription medicines as directed by your health care provider. Follow the directions carefully.   °· If you were prescribed antibiotics, take them as directed. Finish them even if you start to feel better.   °· Rest as instructed by your health care provider.   °· Keep all follow-up appointments with your health care provider.   °SEEK MEDICAL CARE IF:  °· You develop worsening shortness of breath, wheezing, or difficulty breathing.   °· You develop a fever.   °· You have chest pain.   °MAKE SURE YOU:  °· Understand these instructions. °· Will watch your   condition. °· Will get help right away if you are not doing well or get worse. °Document Released: 05/02/2009 Document Revised: 07/10/2013 Document Reviewed: 12/21/2012 °Exit[employer]® Patient Information ©2015 [employer]. This information is not intended to replace advice given to you by your health care provider. Make sure you discuss any questions you have with  your health care provider. ° °

## 2014-11-12 NOTE — Progress Notes (Signed)
CSW continuing to follow.  CSW visited pt room and not family present at bedside.   CSW reviewed chart and noted that MD has consulted palliative medicine team for Goals of Care as pt is not eating.  [facility], but CSW was working with pt guardian, Matthew [name] [phone]) to arrange [facility].   CSW attempted to contact pt guardian, Matthew [name] [phone]) and pt mother, Matthew [name] ([facility] [phone]), but unable to reach either at this time and CSW left voice message.  CSW to continue to follow to provide support and assist with pt disposition needs.   Loletta SpecterSuzanna Kidd, MSW, LCSW Clinical Social Work [phone]

## 2014-11-12 NOTE — Progress Notes (Signed)
I have left a message for Mr. Matthew [name] to discuss goals of care. I have requested a time to speak with him tomorrow. I will await call back. Thank you for this consult.   Yong ChannelAlicia Idrees Quam, NP Palliative Medicine Team Pager # [phone] (M-F 8a-5p) Team Phone # [phone] (Nights/Weekends)

## 2014-11-12 NOTE — Progress Notes (Addendum)
[facility] PROGRESS NOTE  Matthew [name] ZOX:[id number] DOB: [birth year] DOA: 11/06/2014  PCP: Ron Parker, MD  Brief HPI: 64 year old male who resides at a skilled nursing facility. He has advanced dementia and history of seizure disorder. He presented with sepsis and probable aspiration pneumonia. He also had acute respiratory failure. He was admitted to the intensive care unit. Discussions were held with his mother and he was made a DO NOT RESUSCITATE. He also has a guardian. Patient improved, but not eating. Palliative medicine consulted.  Past medical history:  Past Medical History  Diagnosis Date  . Dementia   . Dementia   . Osteoporosis   . Seizures     Consultants: pulmonary and critical care medicine  Procedures: none  Antibiotics: Vancomycin and cefepime 4/20-4/23 Unasyn 4/23  Subjective: Patient somnolent this morning. Discussed with the nurse. He apparently did not take any of his medicines last night. Ate very little. Patient doesn't communicate. No history available from him.   Objective: Vital Signs  Filed Vitals:   11/11/14 1505 11/11/14 2102 11/12/14 0300 11/12/14 0603  BP: 140/68 173/70  120/60  Pulse: 64 68  55  Temp: 98.4 F (36.9 C) 98.2 F (36.8 C)  97.7 F (36.5 C)  TempSrc: Axillary Axillary  Axillary  Resp: Height:      Weight:   54.2 kg (119 lb 7.8 oz)   SpO2: 94% 96%  98%    Intake/Output Summary (Last 24 hours) at 11/12/14 0805 Last data filed at 11/11/14 8119  Gross per 24 hour  Intake      0 ml  Output    400 ml  Net   -400 ml   Filed Weights   11/09/14 2231 11/11/14 0245 11/12/14 0300  Weight: 63 kg (138 lb 14.2 oz) 57.1 kg (125 lb 14.1 oz) 54.2 kg (119 lb 7.8 oz)    General appearance: Sleepy this morning but arousable.  Resp: coarse breath sounds. Diminished at the bases with few crackles. No wheezing. Cardio: regular rate and rhythm, S1, S2 normal, no murmur, click, rub or gallop GI: soft, non-tender;  bowel sounds normal; no masses,  no organomegaly Neurologic: Somnolent but arousable. Disoriented. Moving all extremities.  Lab Results:  Basic Metabolic Panel:  Recent Labs Lab 11/07/14 0335 11/08/14 0330 11/09/14 0355 11/10/14 0652 11/11/14 0430  NA 140 140 143 146* 143  K 3.9 3.4* 4.0 3.4* 4.2  CL 108 105 106 104 102  CO2 34* 32  GLUCOSE 118* 96 123* 120* 133*  BUN CREATININE 0.89 0.76 0.82 0.71 0.76  CALCIUM 8.3* 8.2* 8.5 8.5 8.3*  MG  --   --   --   --  1.8   Liver Function Tests:  Recent Labs Lab 11/06/14 1023  AST 52*  ALT 25  ALKPHOS 48  BILITOT 0.8  PROT 7.2  ALBUMIN 3.1*   CBC:  Recent Labs Lab 11/05/14 0844 11/06/14 1023 11/07/14 0335 11/09/14 0355 11/10/14 0652 11/11/14 0430  WBC 11.0* 8.1 7.0 6.4 5.8 6.0  NEUTROABS 9.6* 6.5  --   --   --   --   HGB 14.4 14.7 12.9* 11.5* 11.9* 12.9*  HCT 41.4 44.4 38.9* 35.4* 35.7* 38.5*  MCV 97.9 97.6 98.0 97.3 96.7 96.0  PLT 67* 109* 74* 74* 85* 89*   CBG:  Recent Labs Lab 11/05/14 0837  GLUCAP 118*    Recent Results (from the past 240 hour(s))  Culture, blood (routine x 2)     Status: None   Collection Time: 11/05/14  8:44 AM  Result Value Ref Range Status   Specimen Description BLOOD RIGHT ANTECUBITAL  Final   Special Requests BOTTLES DRAWN AEROBIC AND ANAEROBIC  Final   Culture   Final    NO GROWTH 5 DAYS Performed at [facility]    Report Status 11/11/2014 FINAL  Final  Culture, blood (routine x 2)     Status: None   Collection Time: 11/05/14  8:50 AM  Result Value Ref Range Status   Specimen Description BLOOD LEFT ANTECUBITAL  Final   Special Requests BOTTLES DRAWN AEROBIC AND ANAEROBIC  Final   Culture   Final    NO GROWTH 5 DAYS Performed at [facility]    Report Status 11/11/2014 FINAL  Final  Urine culture     Status: None   Collection Time: 11/05/14 10:03 AM  Result Value Ref Range Status   Specimen Description URINE,  CATHETERIZED  Final   Special Requests NONE  Final   Colony Count NO GROWTH Performed at [facility]   Final   Culture NO GROWTH Performed at [facility]   Final   Report Status 11/06/2014 FINAL  Final  MRSA PCR Screening     Status: None   Collection Time: 11/06/14  9:41 AM  Result Value Ref Range Status   MRSA by PCR NEGATIVE NEGATIVE Final    Comment:        The GeneXpert MRSA Assay (FDA approved for NASAL specimens only), is one component of a comprehensive MRSA colonization surveillance program. It is not intended to diagnose MRSA infection nor to guide or monitor treatment for MRSA infections.   Blood Culture (routine x 2)     Status: None (Preliminary result)   Collection Time: 11/06/14 10:24 AM  Result Value Ref Range Status   Specimen Description BLOOD RIGHT ANTECUBITAL  Final   Special Requests BOTTLES DRAWN AEROBIC AND ANAEROBIC 1.5ML  Final   Culture   Final           BLOOD CULTURE RECEIVED NO GROWTH TO DATE CULTURE WILL BE HELD FOR 5 DAYS BEFORE ISSUING A FINAL NEGATIVE REPORT Performed at [facility]    Report Status PENDING  Incomplete  Blood Culture (routine x 2)     Status: None (Preliminary result)   Collection Time: 11/06/14 10:24 AM  Result Value Ref Range Status   Specimen Description BLOOD RIGHT WRIST  Final   Special Requests BOTTLES DRAWN AEROBIC AND ANAEROBIC  Final   Culture   Final           BLOOD CULTURE RECEIVED NO GROWTH TO DATE CULTURE WILL BE HELD FOR 5 DAYS BEFORE ISSUING A FINAL NEGATIVE REPORT Performed at [facility]    Report Status PENDING  Incomplete  Urine culture     Status: None   Collection Time: 11/06/14 10:39 AM  Result Value Ref Range Status   Specimen Description IN/OUT CATH URINE  Final   Special Requests NONE  Final   Colony Count NO GROWTH Performed at [facility]   Final   Culture NO GROWTH Performed at [facility]   Final   Report Status 11/07/2014 FINAL   Final      Studies/Results: No results found.  Medications:  Scheduled: . amoxicillin-clavulanate  1 tablet Oral Q12H  . antiseptic oral rinse  7 mL Mouth Rinse q12n4p  .  chlorhexidine  15 mL Mouth Rinse BID  . divalproex  625 mg Oral TID WC  . feeding supplement (ENSURE ENLIVE)  237 mL Oral BID BM  . heparin  5,000 Units Subcutaneous 3 times per day  . memantine  14 mg Oral QPC breakfast  . mirtazapine  15 mg Oral QHS  . risperiDONE  0.25 mg Oral BID  . rivastigmine  9.5 mg Transdermal Daily   Continuous:   BMW:[id number]:albuterol  Assessment/Plan:  Principal Problem:   Acute respiratory failure with hypoxia Active Problems:   Vascular dementia, uncomplicated   Sepsis   Aspiration pneumonia   Seizure disorder    Acute respiratory failure with hypoxia Resolved. Thought to be secondary to aspiration pneumonia. Patient had a seizure recently, which could've contributed to the aspiration. Patient was initially admitted to the intensive care unit and started on broad-spectrum antibiotics. CT scan of the head was unremarkable for any acute event. Discussions were held with his mother who is the next of kin and with his guardian. Patient was made DO NOT RESUSCITATE. His respiratory status appears to be stable. UA was noted to be abnormal, but urine cultures did not show any growth.  Probable aspiration pneumonia with severe sepsis Seems to be improving slowly. Cultures have been negative so far. He was initially transitioned to Unasyn. Yesterday, he was transitioned to Augmentin. But the nurse told me that the patient did not take any of his medicines last night. His oral intake has been extremely poor. He will be changed back to Unasyn for now.   Dysphagia Being followed by speech therapist. Continue dysphagia 1 diet. I have discussed this possibility with his guardian yesterday that if he continues to have poor oral intake, we may need to have further discussions regarding his goals of  care. He was going to discuss with patient's mother as well. We will go ahead and consult palliative medicine for now. .  Acute encephalopathy Improved but still remains disoriented and agitated at times. According to the guardian this is not surprising. Could be close to baseline. No obvious seizures noted. Encephalopathy was likely due to sepsis. Oral intake remains poor, as discussed above.   History of seizure disorder Continue Depakote. He was changed over to oral. Have told the nurse to let me know that if he doesn't swallow the Depakote sprinkles this morning with the pudding and we can change it back to intravenous.   Known history of dementia Continue Namenda and Exelon patch. Risperdal as well.  Hypokalemia Repleted. Magnesium was normal.   Thrombocytopenia Likely due to sepsis. Counts are stable. No evidence for bleeding. Recheck tomorrow  Right suprahilar nodular opacity Incidentally noted on chest x-ray. Considering his other comorbidities, further workup likely not indicated. This was discussed with his guardian and he agrees.  DVT Prophylaxis: heparin    Code Status: DO NOT RESUSCITATE  Family Communication: no family at bedside. I discussed with the patient's guardian Matthew Kayseraylor Kallicult at [phone] on 4/25. Await return call today. Disposition Plan: Palliative medicine has been consulted for goals of care. Patient is not eating. He is from a skilled nursing facility.    LOS: 6 days   [facility] LL[facility]NAN,Murphy Bundick  [facility] Pager [phone] 11/12/2014, 8:05 AM  If 7PM-7AM, please contact night-coverage at [email or web address], password [email or web address]

## 2014-11-13 DIAGNOSIS — Z66 Do not resuscitate: Secondary | ICD-10-CM | POA: Diagnosis not present

## 2014-11-13 DIAGNOSIS — R451 Restlessness and agitation: Secondary | ICD-10-CM

## 2014-11-13 DIAGNOSIS — Z515 Encounter for palliative care: Secondary | ICD-10-CM

## 2014-11-13 LAB — CBC
HCT: 39.4 % (ref 39.0–52.0)
Hemoglobin: 13.4 g/dL (ref 13.0–17.0)
MCH: 32.1 pg (ref 26.0–34.0)
MCHC: 34 g/dL (ref 30.0–36.0)
MCV: 94.5 fL (ref 78.0–100.0)
Platelets: 99 10*3/uL — ABNORMAL LOW (ref 150–400)
RBC: 4.17 MIL/uL — ABNORMAL LOW (ref 4.22–5.81)
RDW: 12.6 % (ref 11.5–15.5)
WBC: 7.6 10*3/uL (ref 4.0–10.5)

## 2014-11-13 LAB — BASIC METABOLIC PANEL
ANION GAP: 6 (ref 5–15)
BUN: 15 mg/dL (ref 6–23)
CO2: 27 mmol/L (ref 19–32)
Calcium: 7.9 mg/dL — ABNORMAL LOW (ref 8.4–10.5)
Chloride: 107 mmol/L (ref 96–112)
Creatinine, Ser: 0.78 mg/dL (ref 0.50–1.35)
GFR calc non Af Amer: >90 mL/min (ref >90)
Glucose, Bld: 111 mg/dL — ABNORMAL HIGH (ref 70–99)
POTASSIUM: 5.2 mmol/L — AB (ref 3.5–5.1)
SODIUM: 140 mmol/L (ref 135–145)

## 2014-11-13 NOTE — Progress Notes (Signed)
Occupational Therapy Treatment Patient Details Name: LEWI DROST MRN: [id number] DOB: 08-Feb-1951 Today's Date: 11/13/2014    History of present illness 64 yo male admitted with acute respiratory failure. Pt is from an ALF and is nonverbal at baseline per chart.    OT comments  Pt not keeping eyes open during session-only intermittently. When attempts are made to encourage simple grooming of washing his face, pt resisting movement/attempts by therapist to guide UE to face. If pt not able to progress toward goals, may need to consider d/c OT services. Will follow currently.   Follow Up Recommendations  SNF;Supervision/Assistance - 24 hour    Equipment Recommendations  Other (comment)    Recommendations for Other Services      Precautions / Restrictions Precautions Precautions: Fall       Mobility Bed Mobility               General bed mobility comments: did not attempt. pt not opening eyes long enough to participate with grooming task.   Transfers                      Balance                                   ADL Overall ADL's : Needs assistance/impaired     Grooming: Wash/dry face;Bed level Grooming Details (indicate cue type and reason): instructed pt on washing his face and placed washcloth in his hand. Pt fidgety and just moving washcloth around in his hand. Pt not attempting to use washcloth purposefully. Attempted to provide hand over hand assist and verbal cues but pt resisting therapist attempt to bring his hand to his face with the cloth X 3 attempts. Pt opening eyes briefly during session but most of the time his eyes were closed. He was reaching with L hand into air and grasping but no objects there. Pt noting to fidget with his covers also. Pt not able to perform grooming on his own and wouldnt allow therapist to assist. (total assist if pt allows assist)                               General ADL Comments: Currently pt  is not able to follow simple commands for ADL.       Vision                     Perception     Praxis      Cognition   Behavior During Therapy: Restless Overall Cognitive Status: History of cognitive impairments - at baseline                       Extremity/Trunk Assessment               Exercises     Shoulder Instructions       General Comments      Pertinent Vitals/ Pain       Pain Assessment: Faces Faces Pain Scale: No hurt  Home Living                                          Prior Functioning/Environment  Frequency Min 2X/week     Progress Toward Goals  OT Goals(current goals can now be found in the care plan section)  Progress towards OT goals: Not progressing toward goals - comment (decreased participation)     Plan Discharge plan remains appropriate    Co-evaluation                 End of Session     Activity Tolerance Other (comment) (cognition)   Patient Left in bed;with bed alarm set;with call bell/phone within reach   Nurse Communication          Time: 1125-1135 OT Time Calculation (min): 10 min  Charges: OT General Charges $OT Visit: 1 Procedure OT Treatments $Self Care/Home Management : 8-22 mins  Lennox LaityStone, Herbie Lehrmann Stafford  [phone] 11/13/2014, 11:47 AM

## 2014-11-13 NOTE — Progress Notes (Signed)
[facility] PROGRESS NOTE  Matthew [name] WJX:[id number] DOB: 1950-10-27 DOA: 11/06/2014  PCP: Ron Parker, MD  Brief HPI: 64 year old male who resides at a skilled nursing facility. He has advanced dementia and history of seizure disorder. He presented with sepsis and probable aspiration pneumonia. He also had acute respiratory failure. He was admitted to the intensive care unit. Discussions were held with his mother and he was made a DO NOT RESUSCITATE. He also has a guardian. Patient improved, but not eating. Palliative medicine consulted.  Past medical history:  Past Medical History  Diagnosis Date  . Dementia   . Dementia   . Osteoporosis   . Seizures     Consultants: pulmonary and critical care medicine  Procedures: none  Antibiotics: Vancomycin and cefepime 4/20-4/23 Unasyn 4/23  Subjective:    Objective: Vital Signs  Filed Vitals:   11/12/14 2100 11/13/14 0517 11/13/14 0537 11/13/14 1424  BP: 134/70 122/107  138/76  Pulse: 64 125 62 65  Temp: 99 F (37.2 C) 97.9 F (36.6 C)  98.8 F (37.1 C)  TempSrc: Axillary Axillary  Oral  Resp: Height:      Weight:  55.3 kg (121 lb 14.6 oz)    SpO2: 100% 93%  99%    Intake/Output Summary (Last 24 hours) at 11/13/14 1717 Last data filed at 11/13/14 1600  Gross per 24 hour  Intake    340 ml  Output   1675 ml  Net  -1335 ml   Filed Weights   11/11/14 0245 11/12/14 0300 11/13/14 0517  Weight: 57.1 kg (125 lb 14.1 oz) 54.2 kg (119 lb 7.8 oz) 55.3 kg (121 lb 14.6 oz)    General appearance: Sleepy this morning but arousable.  Resp: coarse breath sounds. Diminished at the bases with few crackles. No wheezing. Cardio: regular rate and rhythm, S1, S2 normal, no murmur, click, rub or gallop GI: soft, non-tender; bowel sounds normal; no masses,  no organomegaly Neurologic: Somnolent but arousable. Disoriented. Moving all extremities.  Lab Results:  Basic Metabolic Panel:  Recent Labs Lab  11/08/14 0330 11/09/14 0355 11/10/14 0652 11/11/14 0430 11/13/14 0420  NA 140 143 146* 143 140  K 3.4* 4.0 3.4* 4.2 5.2*  CL 105 106 104 102 107  CO2 28 30 34* 32 27  GLUCOSE 96 123* 120* 133* 111*  BUN CREATININE 0.76 0.82 0.71 0.76 0.78  CALCIUM 8.2* 8.5 8.5 8.3* 7.9*  MG  --   --   --  1.8  --    Liver Function Tests: No results for input(s): AST, ALT, ALKPHOS, BILITOT, PROT, ALBUMIN in the last 168 hours. CBC:  Recent Labs Lab 11/07/14 0335 11/09/14 0355 11/10/14 0652 11/11/14 0430 11/13/14 0420  WBC 7.0 6.4 5.8 6.0 7.6  HGB 12.9* 11.5* 11.9* 12.9* 13.4  HCT 38.9* 35.4* 35.7* 38.5* 39.4  MCV 98.0 97.3 96.7 96.0 94.5  PLT 74* 74* 85* 89* 99*   CBG: No results for input(s): GLUCAP in the last 168 hours.  Recent Results (from the past 240 hour(s))  Culture, blood (routine x 2)     Status: None   Collection Time: 11/05/14  8:44 AM  Result Value Ref Range Status   Specimen Description BLOOD RIGHT ANTECUBITAL  Final   Special Requests BOTTLES DRAWN AEROBIC AND ANAEROBIC  Final   Culture   Final    NO GROWTH 5 DAYS Performed at [facility]    Report  Status 11/11/2014 FINAL  Final  Culture, blood (routine x 2)     Status: None   Collection Time: 11/05/14  8:50 AM  Result Value Ref Range Status   Specimen Description BLOOD LEFT ANTECUBITAL  Final   Special Requests BOTTLES DRAWN AEROBIC AND ANAEROBIC 4ML  Final   Culture   Final    NO GROWTH 5 DAYS [facility]    Report Status 11/11/2014 FINAL  Final  Urine culture     Status: None   Collection Time: 11/05/14 10:03 AM  Result Value Ref Range Status   Specimen Description URINE, CATHETERIZED  Final   Special Requests NONE  Final   Colony Count NO GROWTH [facility]   Final   Culture NO GROWTH [facility]   Final   Report Status 11/06/2014 FINAL  Final  MRSA PCR Screening     Status: None   Collection Time:  11/06/14  9:41 AM  Result Value Ref Range Status   MRSA by PCR NEGATIVE NEGATIVE Final    Comment:        The GeneXpert MRSA Assay (FDA approved for NASAL specimens only), is one component of a comprehensive MRSA colonization surveillance program. It is not intended to diagnose MRSA infection nor to guide or monitor treatment for MRSA infections.   Blood Culture (routine x 2)     Status: None   Collection Time: 11/06/14 10:24 AM  Result Value Ref Range Status   Specimen Description BLOOD RIGHT ANTECUBITAL  Final   Special Requests BOTTLES DRAWN AEROBIC AND ANAEROBIC 1.5ML  Final   Culture   Final    NO GROWTH 5 DAYS [facility]formed at [facility]    Report Status 11/12/2014 FINAL  Final  Blood Culture (routine x 2)     Status: None   Collection Time: 11/06/14 10:24 AM  Result Value Ref Range Status   Specimen Description BLOOD RIGHT WRIST  Final   Special Requests BOTTLES DRAWN AEROBIC AND ANAEROBIC 1ML  Final   Culture   Final    NO GROWTH 5 DAYS Note: Culture results may be compromised due to an inadequate volume of blood received in culture bottles. [facility]formed at [facility]    Report Status 11/12/2014 FINAL  Final  Urine culture     Status: None   Collection Time: 11/06/14 10:39 AM  Result Value Ref Range Status   Specimen Description IN/OUT CATH URINE  Final   Special Requests NONE  Final   Colony Count NO GROWTH [facility]   Final   Culture NO GROWTH [facility]   Final   Report Status 11/07/2014 FINAL  Final      Studies/Results: No results found.  Medications:  Scheduled: . ampicillin-sulbactam (UNASYN) IV  3 g Intravenous Q6H  . antiseptic oral rinse  7 mL Mouth Rinse q12n4p  . chlorhexidine  15 mL Mouth Rinse BID  . divalproex  625 mg Oral TID WC  . feeding supplement (ENSURE ENLIVE)  237 mL Oral BID BM  . heparin  5,000 Units Subcutaneous 3 times per day  . memantine  14 mg Oral QPC  breakfast  . mirtazapine  15 mg Oral QHS  . risperiDONE  0.25 mg Oral BID  . rivastigmine  9.5 mg Transdermal Daily   Continuous:   ZOX:[id number]:albuterol  Assessment/Plan:  Principal Problem:   Acute respiratory failure with hypoxia Active Problems:   Vascular  dementia, uncomplicated   Sepsis   Aspiration pneumonia   Seizure disorder   Dysphagia   Palliative care encounter   Agitated   DNR (do not resuscitate)    Acute respiratory failure with hypoxia Resolved. Thought to be secondary to aspiration pneumonia. Patient had a seizure recently, which could've contributed to the aspiration. Patient was initially admitted to the intensive care unit and started on broad-spectrum antibiotics. CT scan of the head was unremarkable for any acute event. Discussions were held with his mother who is the next of kin and with his guardian. Patient was made DO NOT RESUSCITATE. His respiratory status appears to be stable.  UA was noted to be abnormal, but urine cultures did not show any growth.    Probable aspiration pneumonia with severe sepsis Seems to be improving slowly. Cultures have been negative so far.  He was initially transitioned to Unasyn And because of poor by mouth intake transitioned from by mouth antibiotics back to Unasyn--- I have discontinued his antibiotics 4/27   Dysphagia Being followed by speech therapists ssions regarding his goals of care. He was going to discuss with patient's mother as well. We will go ahead and consult palliative medicine for now. .  Acute encephalopathy disoriented and agitated at times.   Could be close to baseline. No obvious seizures noted.  Encephalopathy was likely due to sepsis.   History of seizure disorder Continue Depakote. He was changed over to oral.  Have told the nurse to let me know that if he doesn't swallow the Depakote sprinkles this morning with the pudding and we can change it back to intravenous.   Known history of dementia   I  have discontinued  Namenda and Exelon patch on 4/27 . he may continue Risperdal   Hypokalemia Repleted. Magnesium was normal.   Thrombocytopenia Likely due to sepsis. Counts are stable. No evidence for bleeding. Recheck tomorrow  Right suprahilar nodular opacity Incidentally noted on chest x-ray. Considering his other comorbidities, further workup likely not indicated. This was discussed with his guardian and he agrees.  DVT Prophylaxis: heparin    Code Status: DO NOT RESUSCITATE  Family Communication: no family at bedside. Disposition Plan: Palliative medicine has been consulted for goals of care. likely patient will be discharged to a skilled nursing facility with palliative following versus freestanding hospice in 1-2 days   LOS: 7 days   Pleas Koch, MD [facility] ([phone]   If 7PM-7AM, please contact night-coverage at [email or web address], password [facility]

## 2014-11-13 NOTE — Consult Note (Signed)
Consultation Note Date: 11/13/2014   Patient Name: Matthew [name]  DOB: [birth year]  MRN: [id number]  Age / Sex: 64 y.o., male   PCP: Ron ParkerSamuel Bowen, MD Referring Physician: Rhetta MuraJai-Gurmukh Samtani, MD  Reason for Consultation: Disposition and Establishing goals of care  Palliative Care Assessment and Plan Summary of Established Goals of Care and Medical Treatment Preferences    Palliative Care Discussion Held Today Contacts/Participants in Discussion: Primary Decision Maker: Matthew [name] [phone], [phone]) HCPOA: yes - court appointed guardian  Matthew [name] is lying in bed, not following any commands and resisting care from nurse aide at bedside. He is very uncooperative. Unfortunately he is unable to participate in his goals of care at this point in his illness. I spoke with his legal guardian, Matthew [name], about our concerns for that he is not eating and that he will not participate in a rehab program. Ladona Ridgelaylor has worked with Matthew [name] a few years and says he is not able to converse at baseline (although his mother can get more response from him) but was for the most part caring for himself in ALF. Ladona Ridgelaylor recognizes that his quality of life is poor at baseline and this is a significant decline from his state prior to admission. He says that he had a conversation when Matthew [name] was in ICU about artificial feeding and I agreed that I do not feel this would be appropriate as Matthew [name] would pull it out and it is not shown to increase quality of quantity of life in patients with dementia. [facility] for focus on letting Matthew [name] eat what he wants and we try and make him as happy and comfortable as we can and even enlisting the help of hospice to assist with this. Ladona Ridgelaylor says that he favors no artificial feeding and comfort approach but wishes to speak in person with Matthew [name]'s mother before a final decision is made. He will try and speak with her this afternoon/evening.  I offered to speak with her as well if she has further questions/concerns. Suzanna, CSW, is helping with our options for this plan (i.e. Hospice facility vs SNF with comfort care). Ladona Ridgelaylor to follow up with decisions.   Code Status/Advance Care Planning:  DNR   Symptom Management:   Agitation: Agree to continue Exelon, risperidone, and depakote [name]. May also consider low dose haldol 1-2 mg prn if needed.   Decreased appetite/dysphagia/aspiration risk: Continue recommendations by SLP. RN reports he favors ice cream and will eat magic cup fairly consistently but very little otherwise. Inconsistently accepting Ensure. Encourage/offer foods/consistencies he enjoys. Recommend comfort feeding.    Palliative Prophylaxis: Recommend colace daily if he will accept.   Psycho-social/Spiritual:   Support System: Mother is supportive but her capabilities may be limited to be available often. No other family per legal guardian.   Prognosis: Likely weeks with little intake but he is young and could be longer.   Discharge Planning:  Recommending hospice care - facility of delivery of care to be decided and help from CSW for help with options.        Chief Complaint/History of Present Illness: 64 year old male who resides at a assisted living facility. He has advanced dementia and history of seizure disorder. He presented with sepsis and probable aspiration pneumonia. He also had acute respiratory failure. He was admitted to the intensive care unit. Discussions were held with his mother and he was made a DO NOT RESUSCITATE. He also has a guardian who  agrees with the plan as well. He has made little improvement and significant change in baseline status with little intake. Prognosis is very poor.   Primary Diagnoses  Present on Admission:  . Sepsis . Acute respiratory failure with hypoxia . Aspiration pneumonia . Vascular dementia, uncomplicated  Palliative Review of Systems: Unable to assess -  confused/nonverbal - only moaning  I have reviewed the medical record, interviewed the patient and family, and examined the patient. The following aspects are pertinent.  Past Medical History  Diagnosis Date  . Dementia   . Dementia   . Osteoporosis   . Seizures    History   Social History  . Marital Status: Single    Spouse Name: N/A  . Number of Children: N/A  . Years of Education: N/A   Social History Main Topics  . Smoking status: Unknown If Ever Smoked  . Smokeless tobacco: Never Used  . Alcohol Use: No  . Drug Use: No  . Sexual Activity: No   Other Topics Concern  . None   Social History Narrative   History reviewed. No pertinent family history. Scheduled Meds: . ampicillin-sulbactam (UNASYN) IV  3 g Intravenous Q6H  . antiseptic oral rinse  7 mL Mouth Rinse q12n4p  . chlorhexidine  15 mL Mouth Rinse BID  . divalproex  625 mg Oral TID WC  . feeding supplement (ENSURE ENLIVE)  237 mL Oral BID BM  . heparin  5,000 Units Subcutaneous 3 times per day  . memantine  14 mg Oral QPC breakfast  . mirtazapine  15 mg Oral QHS  . risperiDONE  0.25 mg Oral BID  . rivastigmine  9.5 mg Transdermal Daily   Continuous Infusions:  PRN Meds:.albuterol Medications Prior to Admission:  Prior to Admission medications   Medication Sig Start Date End Date Taking? Authorizing Provider  acetaminophen (TYLENOL) 500 MG tablet Take 500 mg by mouth every 4 (four) hours as needed for mild pain, fever or headache.    Yes Historical Provider, MD  alum & mag hydroxide-simeth (MAALOX/MYLANTA) 200-200-20 MG/5ML suspension Take 30 mLs by mouth every 6 (six) hours as needed for indigestion or heartburn.   Yes Historical Provider, MD  divalproex (DEPAKOTE [name]) 125 MG capsule Take 625 mg by mouth 3 (three) times daily with meals.    Yes Historical Provider, MD  guaiFENesin (ROBITUSSIN) 100 MG/5ML liquid Take 200 mg by mouth every 6 (six) hours as needed for cough.   Yes Historical Provider, MD   lactobacillus acidophilus & bulgar (LACTINEX) chewable tablet Chew 2 tablets by mouth 3 (three) times daily with meals.    Yes Historical Provider, MD  loperamide (IMODIUM) 2 MG capsule Take 2 mg by mouth as needed for diarrhea or loose stools.   Yes Historical Provider, MD  magnesium hydroxide (MILK OF MAGNESIA) 400 MG/5ML suspension Take 30 mLs by mouth at bedtime as needed for mild constipation.   Yes Historical Provider, MD  Memantine HCl ER (NAMENDA XR) 14 MG CP24 Take 14 mg by mouth daily after breakfast.    Yes Historical Provider, MD  mirtazapine (REMERON) 15 MG tablet Take 15 mg by mouth at bedtime.    Yes Historical Provider, MD  neomycin-bacitracin-polymyxin (NEOSPORIN) ointment Apply 1 application topically as needed for wound care.    Yes Historical Provider, MD  risperiDONE (RISPERDAL) 1 MG/ML oral solution Take 0.25 mg by mouth 2 (two) times daily.    Yes Historical Provider, MD  rivastigmine (EXELON) 9.5 mg/24hr Place 1 patch onto the  skin daily.   Yes Historical Provider, MD  Vitamin D, Ergocalciferol, (DRISDOL) 50000 UNITS CAPS capsule Take 50,000 Units by mouth every Wednesday.    Yes Historical Provider, MD  albuterol (PROVENTIL) (2.5 MG/3ML) 0.083% nebulizer solution Take 3 mLs (2.5 mg total) by nebulization every 2 (two) hours as needed for wheezing. 11/12/14   Osvaldo Shipper, MD  amoxicillin-clavulanate (AUGMENTIN) 875-125 MG per tablet Take 1 tablet by mouth every 12 (twelve) hours. For 5 more days 11/12/14   Osvaldo Shipper, MD  feeding supplement, ENSURE ENLIVE, (ENSURE ENLIVE) LIQD Take 237 mLs by mouth 2 (two) times daily between meals. 11/12/14   Osvaldo Shipper, MD  levofloxacin (LEVAQUIN) 750 MG tablet Take 1 tablet (750 mg total) by mouth daily. X 7 days Patient not taking: Reported on 11/05/2014 08/05/14   Joycie Peek, PA-C   No Known Allergies CBC:    Component Value Date/Time   WBC 7.6 11/13/2014 0420   HGB 13.4 11/13/2014 0420   HCT 39.4 11/13/2014 0420   PLT  99* 11/13/2014 0420   MCV 94.5 11/13/2014 0420   NEUTROABS 6.5 11/06/2014 1023   LYMPHSABS 0.6* 11/06/2014 1023   MONOABS 1.0 11/06/2014 1023   EOSABS 0.0 11/06/2014 1023   BASOSABS 0.0 11/06/2014 1023   Comprehensive Metabolic Panel:    Component Value Date/Time   NA 140 11/13/2014 0420   K 5.2* 11/13/2014 0420   CL 107 11/13/2014 0420   CO2 27 11/13/2014 0420   BUN 15 11/13/2014 0420   CREATININE 0.78 11/13/2014 0420   GLUCOSE 111* 11/13/2014 0420   CALCIUM 7.9* 11/13/2014 0420   AST 52* 11/06/2014 1023   ALT 25 11/06/2014 1023   ALKPHOS 48 11/06/2014 1023   BILITOT 0.8 11/06/2014 1023   PROT 7.2 11/06/2014 1023   ALBUMIN 3.1* 11/06/2014 1023    Physical Exam: Vital Signs: BP 122/107 mmHg  Pulse 62  Temp(Src) 97.9 F (36.6 C) (Axillary)  Resp 20  Ht 5' 8.11" (1.73 m)  Wt 55.3 kg (121 lb 14.6 oz)  BMI 18.48 kg/m2  SpO2 93% SpO2: SpO2: 93 % O2 Device: O2 Device: Not Delivered O2 Flow Rate: O2 Flow Rate (L/min): 2 L/min Intake/output summary:  Intake/Output Summary (Last 24 hours) at 11/13/14 1422 Last data filed at 11/13/14 0900  Gross per 24 hour  Intake    340 ml  Output   1375 ml  Net  -1035 ml   LBM:  4/25 Baseline Weight: Weight: 52.164 kg (115 lb) Most recent weight: Weight: 55.3 kg (121 lb 14.6 oz)  Exam Findings: General: Lying in bed, thin, frail, uncooperative HEENT: [place]/AT, no JVD, moist mucous membranes CV: RRR, S1 S2 Respiratory: Diminished throughout, bibasilar crackles, no labored breathing, symmetric Abdomen: Soft, Non-tender, non-distended, +BS Extremities: MAE, no edema, warm to touch Neuro/Psych: Does not follow commands, resisting care         Palliative Performance Scale: 30%              Additional Data Reviewed: Recent Labs     11/11/14  0430  11/13/14  0420  WBC  6.0  7.6  HGB  12.9*  13.4  PLT  89*  99*  NA  143  140  BUN  14  15  CREATININE  0.76  0.78     Time In: 1340 Time Out: 1440 Time Total:  Greater  than 50%  of this time was spent counseling and coordinating care related to the above assessment and plan.  Signed by: Jimmey Ralph,  Broadus John, NP  Ulice Bold, NP  11/13/2014, 2:22 PM  Please contact Palliative Medicine Team phone at [phone] for questions and concerns.

## 2014-11-13 NOTE — Progress Notes (Signed)
Patient able to eat 2 cups of ice cream with meds and drank 120 ml of ensure enlive today.

## 2014-11-13 NOTE — Progress Notes (Addendum)
CSW continuing to follow.  CSW contacted pt guardian, Marnee Guarneriaylor [place] via telephone.   CSW spoke with pt guardian and pt guardian stated that he and PMT NP, Yong Channellicia Parker have been attempting to get in touch with each other and pt guardian plans to contact PMT NP upon getting off the phone with CSW. CSW encouraged pt guardian to get in contact with PMT NP in order to address pt goals of care.   CSW awaiting recommendations from PMT GOC in order to appropriately assist with pt disposition needs.   CSW to continue to follow to provide support and assist with pt disposition needs.   Addendum:  CSW received phone call from PMT NP, Yong ChannelAlicia Parker and pt guardian, Marnee Guarneriaylor [place].   Pt guardian states that he was able to speak with pt mother and they are both in agreement that a PEG tube would not be in pt best interest and are agreeable to transitioning to comfort.   CSW discussed with pt guardian that CSW can explore hospice facility to determine if pt meets eligibility for inpatient hospice versus SNF with hospice services to follow at SNF. CSW explained that if pt went to SNF then room and board at SNF would be private pay in order for Hospice to bill pt insurance. Pt guardian expressed understanding and agreeable to exploring these options.   [facility] and [facility]  [facility] following.   CSW to follow up with pt guardian tomorrow morning regarding options.   Loletta SpecterSuzanna Kidd, MSW, LCSW Clinical Social Work [phone]

## 2014-11-14 NOTE — Progress Notes (Signed)
CSW continuing to follow.  CSW received notification from [facility] that facility will be sending an RN to evaluate pt at 1:30 pm.   CSW met with  [facility] [place], Baxter Flattery at bedside.  [facility] evaluated pt and discussed with hospice MD and pt appropriate for residential bed at  [facility]. Bed available today.   CSW contacted pt guardian, Nicholes Calamity who is agreeable to plan for  [facility] residential bed for pt. Pt guardian aware of discharge today and will update pt mother on plan.   CSW facilitated pt discharge needs including contacting  [facility] discussing with pt guardian, Equities trader via telephone, providing RN phone number to call report, arranging ambulance transport for pt to [facility] scheduled for 5 pm pick up.   Pt guardian appreciative of CSW assistance and feels that pt will be best served in residential bed at [facility].  No further social work needs identified at this time.  CSW signing off.   Alison Murray, MSW, [place] Work [phone]

## 2014-11-14 NOTE — Discharge Summary (Addendum)
Physician Discharge Summary  Matthew [name] ZOX:[id number] DOB: 05-08-1951 DOA: 11/06/2014  PCP: Ron Parker, MD  Admit date: 11/06/2014 Discharge date: 11/14/2014  Time spent: 15 minutes  Recommendations for Outpatient Follow-up:  1. patient d/c to [facility] hospice house c Vascular dementia and sepsis and aspiration with a  Prognosis of less than 6 mo Goal of care is currently comfort ?multiple meds d/c'd  Discharge Diagnoses:  Principal Problem:   Acute respiratory failure with hypoxia Active Problems:   Vascular dementia, uncomplicated   Sepsis   Aspiration pneumonia   Seizure disorder   Dysphagia   Palliative care encounter   Agitated   DNR (do not resuscitate)  Discharge Condition: gaurded  Diet recommendation: Comfort  Filed Weights   11/12/14 0300 11/13/14 0517 11/14/14 0543  Weight: 54.2 kg (119 lb 7.8 oz) 55.3 kg (121 lb 14.6 oz) 54.9 kg (121 lb 0.5 oz)    History of present illness:  63 year old male who resides at a skilled nursing facility. He has advanced dementia and history of seizure disorder. He presented with sepsis and probable aspiration pneumonia. He also had acute respiratory failure. He was admitted to the intensive care unit. Discussions were held with his mother and he was made a DO NOT RESUSCITATE. He also has a guardian. Patient improved, but not eating. Palliative medicine consulted.  Hospital Course:  Acute respiratory failure with hypoxia had a seizure recently, which could've contributed to the aspiration Patient was initially admitted to the intensive care unit and started on broad-spectrum antibiotics.  CT scan of the head was unremarkable for any acute event. Discussions were held with his mother who is the next of kin and with his guardian.  Patient was made DO NOT RESUSCITATE.   Probable aspiration pneumonia with severe sepsis  Cultures have been negative so far.  He was initially transitioned to Unasyn And because of poor by mouth  intake transitioned from by mouth antibiotics back to Unasyn  discontinued his antibiotics 4/27 as focus now comfort  Dysphagia Being followed by speech therapists ssions regarding his goals of care.  D/c to home or facility with Hospice following  Acute encephalopathy disoriented and agitated at times.  could be close to baseline. No obvious seizures noted.  Encephalopathy was likely due to sepsis.   History of seizure disorder Continue Depakote. He was changed over to oral.  Have told the nurse to let me know that if he doesn't swallow the Depakote sprinkles this morning with the pudding and we can change it back to intravenous.   Known history of dementia  I have discontinued Namenda and Exelon patch on 4/27 . he may continue Risperdal for agitation if needed  Hypokalemia Repleted. Magnesium was normal.   Thrombocytopenia Likely due to sepsis. Counts are stable. No evidence for bleeding. Recheck tomorrow  Right suprahilar nodular opacity Incidentally noted on chest x-ray. Considering his other comorbidities, further workup likely not indicated. This was discussed with his guardian and he agrees.  Consultants:  pulmonary and critical care medicine Palliative medicine  Procedures: none  Antibiotics: Vancomycin and cefepime 4/20-4/23 Unasyn 4/23-4/27    Discharge Exam: Filed Vitals:   11/14/14 0543  BP: 153/78  Pulse: 73  Temp: 99.4 F (37.4 C)  Resp: 18    General: eomi, ncat-somewhat responsive Cardiovascular: s1 s 2 no m/r/g Respiratory: clear  Discharge Instructions   Discharge Instructions    Diet - low sodium heart healthy    Complete by:  As directed  Increase activity slowly    Complete by:  As directed           Current Discharge Medication List    START taking these medications   Details  albuterol (PROVENTIL) (2.5 MG/3ML) 0.083% nebulizer solution Take 3 mLs (2.5 mg total) by nebulization every 2 (two) hours as needed for  wheezing. Qty: 75 mL, Refills: 12    feeding supplement, ENSURE ENLIVE, (ENSURE ENLIVE) LIQD Take 237 mLs by mouth 2 (two) times daily between meals. Qty: 237 mL, Refills: 12      CONTINUE these medications which have NOT CHANGED   Details  acetaminophen (TYLENOL) 500 MG tablet Take 500 mg by mouth every 4 (four) hours as needed for mild pain, fever or headache.     alum & mag hydroxide-simeth (MAALOX/MYLANTA) 200-200-20 MG/5ML suspension Take 30 mLs by mouth every 6 (six) hours as needed for indigestion or heartburn.    divalproex (DEPAKOTE SPRINKLE) 125 MG capsule Take 625 mg by mouth 3 (three) times daily with meals.     guaiFENesin (ROBITUSSIN) 100 MG/5ML liquid Take 200 mg by mouth every 6 (six) hours as needed for cough.    lactobacillus acidophilus & bulgar (LACTINEX) chewable tablet Chew 2 tablets by mouth 3 (three) times daily with meals.     loperamide (IMODIUM) 2 MG capsule Take 2 mg by mouth as needed for diarrhea or loose stools.    magnesium hydroxide (MILK OF MAGNESIA) 400 MG/5ML suspension Take 30 mLs by mouth at bedtime as needed for mild constipation.    neomycin-bacitracin-polymyxin (NEOSPORIN) ointment Apply 1 application topically as needed for wound care.     risperiDONE (RISPERDAL) 1 MG/ML oral solution Take 0.25 mg by mouth 2 (two) times daily.     Vitamin D, Ergocalciferol, (DRISDOL) 50000 UNITS CAPS capsule Take 50,000 Units by mouth every Wednesday.       STOP taking these medications     Memantine HCl ER (NAMENDA XR) 14 MG CP24      mirtazapine (REMERON) 15 MG tablet      rivastigmine (EXELON) 9.5 mg/24hr      levofloxacin (LEVAQUIN) 750 MG tablet        No Known Allergies Follow-up Information    Follow up with [facility], [place]. Schedule an appointment as soon as possible for a visit in 1 week.   Specialty:  Internal Medicine   Why:  post hospitalization follow up       The results of significant diagnostics from this hospitalization  (including imaging, microbiology, ancillary and laboratory) are listed below for reference.    Significant Diagnostic Studies: Dg Chest 1 View  11/05/2014   CLINICAL DATA:  Seizure, fever  EXAM: CHEST  1 VIEW  COMPARISON:  08/05/2014  FINDINGS: Heart size and vascularity normal. Mild hyperinflation. Negative for pneumonia or heart failure. No pleural effusion  Possible 12 mm right upper lobe density not seen on the prior study. This overlies the right anterior second rib and could be calcified costal cartilage versus a lung nodule. As this is not identified on prior studies, CT chest recommended for further evaluation.  IMPRESSION: Possible 12 mm nodule right upper lobe. Recommend CT chest with contrast for further evaluation.   Electronically Signed   By: Marlan Palau M.D.   On: 11/05/2014 09:34   Ct Head Wo Contrast  11/05/2014   CLINICAL DATA:  Seizure today  EXAM: CT HEAD WITHOUT CONTRAST  TECHNIQUE: Contiguous axial images were obtained from the base of the skull through  the vertex without intravenous contrast.  COMPARISON:  CT head 08/05/2014  FINDINGS: Moderate to advanced atrophy unchanged. Chronic microvascular ischemic change in the white matter. Chronic left parietal infarct unchanged.  Negative for acute infarct.  Negative for hemorrhage or mass lesion.  Calvarium intact.  IMPRESSION: Atrophy and chronic ischemia. No acute abnormality and no change from the prior study.   Electronically Signed   By: Marlan Palau M.D.   On: 11/05/2014 09:30   Dg Chest Port 1 View  11/07/2014   CLINICAL DATA:  Pneumonia  EXAM: PORTABLE CHEST - 1 VIEW  COMPARISON:  11/06/2014  FINDINGS: Increased consolidation right lower lobe. Mild left lung base opacity has also increased. Background interstitial coarsening. Right suprahilar nodular opacity again questioned. No pleural effusion or pneumothorax. Cardiomediastinal contours within normal range. No acute osseous finding.  IMPRESSION: Right greater than left  lower lobe airspace opacities, concerning for pneumonia.  Right suprahilar nodular opacity again questioned.  A chest CT is again recommended.   Electronically Signed   By: Jearld Lesch M.D.   On: 11/07/2014 05:38   Dg Chest Port 1 View  11/06/2014   CLINICAL DATA:  Cough.  EXAM: PORTABLE CHEST - 1 VIEW  COMPARISON:  11/05/2014 and 08/05/2014 and 03/14/2013  FINDINGS: There is a new patchy infiltrate in the right mid and lower lung zone. There is a spiculated density at the superior aspect of the right hilum which is worrisome for a mass.  There is also slight new accentuation of the interstitial markings at the left lung base. Heart size and pulmonary vascularity are normal.  IMPRESSION: Developing bibasilar pneumonia. Possible mass superior to the right hilum.   Electronically Signed   By: Francene Boyers M.D.   On: 11/06/2014 10:25    Microbiology: Recent Results (from the past 240 hour(s))  Culture, blood (routine x 2)     Status: None   Collection Time: 11/05/14  8:44 AM  Result Value Ref Range Status   Specimen Description BLOOD RIGHT ANTECUBITAL  Final   Special Requests BOTTLES DRAWN AEROBIC AND ANAEROBIC  Final   Culture   Final    NO GROWTH 5 DAYS Performed at [facility]    Report Status 11/11/2014 FINAL  Final  Culture, blood (routine x 2)     Status: None   Collection Time: 11/05/14  8:50 AM  Result Value Ref Range Status   Specimen Description BLOOD LEFT ANTECUBITAL  Final   Special Requests BOTTLES DRAWN AEROBIC AND ANAEROBIC  Final   Culture   Final    NO GROWTH 5 DAYS Performed at [facility]    Report Status 11/11/2014 FINAL  Final  Urine culture     Status: None   Collection Time: 11/05/14 10:03 AM  Result Value Ref Range Status   Specimen Description URINE, CATHETERIZED  Final   Special Requests NONE  Final   Colony Count NO GROWTH Performed at [facility]   Final   Culture NO GROWTH Performed at [facility]    Final   Report Status 11/06/2014 FINAL  Final  MRSA PCR Screening     Status: None   Collection Time: 11/06/14  9:41 AM  Result Value Ref Range Status   MRSA by PCR NEGATIVE NEGATIVE Final    Comment:        The GeneXpert MRSA Assay (FDA approved for NASAL specimens only), is one component of a comprehensive MRSA colonization surveillance program. It is not  intended to diagnose MRSA infection nor to guide or monitor treatment for MRSA infections.   Blood Culture (routine x 2)     Status: None   Collection Time: 11/06/14 10:24 AM  Result Value Ref Range Status   Specimen Description BLOOD RIGHT ANTECUBITAL  Final   Special Requests BOTTLES DRAWN AEROBIC AND ANAEROBIC 1.5ML  Final   Culture   Final    NO GROWTH 5 DAYS [facility]    Report Status 11/12/2014 FINAL  Final  Blood Culture (routine x 2)     Status: None   Collection Time: 11/06/14 10:24 AM  Result Value Ref Range Status   Specimen Description BLOOD RIGHT WRIST  Final   Special Requests BOTTLES DRAWN AEROBIC AND ANAEROBIC 1ML  Final   Culture   Final    NO GROWTH 5 DAYS Note: Culture results may be compromised due to an inadequate volume of blood received in culture bottles. [facility]formed at [facility]    Report Status 11/12/2014 FINAL  Final  Urine culture     Status: None   Collection Time: 11/06/14 10:39 AM  Result Value Ref Range Status   Specimen Description IN/OUT CATH URINE  Final   Special Requests NONE  Final   Colony Count NO GROWTH [facility] at [facility]   Final   Culture NO GROWTH [facility] at [facility]   Final   Report Status 11/07/2014 FINAL  Final     Labs: Basic Metabolic Panel:  Recent Labs Lab 11/08/14 0330 11/09/14 0355 11/10/14 0652 11/11/14 0430 11/13/14 0420  NA 140 143 146* 143 140  K 3.4* 4.0 3.4* 4.2 5.2*  CL 105 106 104 102 107  CO2 28 30 34* 32 27  GLUCOSE 96 123* 120* 133* 111*  BUN 15 14 13 14 15   CREATININE 0.76  0.82 0.71 0.76 0.78  CALCIUM 8.2* 8.5 8.5 8.3* 7.9*  MG  --   --   --  1.8  --    Liver Function Tests: No results for input(s): AST, ALT, ALKPHOS, BILITOT, PROT, ALBUMIN in the last 168 hours. No results for input(s): LIPASE, AMYLASE in the last 168 hours. No results for input(s): AMMONIA in the last 168 hours. CBC:  Recent Labs Lab 11/09/14 0355 11/10/14 0652 11/11/14 0430 11/13/14 0420  WBC 6.4 5.8 6.0 7.6  HGB 11.5* 11.9* 12.9* 13.4  HCT 35.4* 35.7* 38.5* 39.4  MCV 97.3 96.7 96.0 94.5  PLT 74* 85* 89* 99*   Cardiac Enzymes: No results for input(s): CKTOTAL, CKMB, CKMBINDEX, TROPONINI in the last 168 hours. BNP: BNP (last 3 results) No results for input(s): BNP in the last 8760 hours.  ProBNP (last 3 results) No results for input(s): PROBNP in the last 8760 hours.  CBG: No results for input(s): GLUCAP in the last 168 hours.     SignedRhetta Mura:  Patrica Mendell, JAI-GURMUKH  [facility] 11/14/2014, 8:44 AM

## 2014-11-14 NOTE — Progress Notes (Signed)
Mr. Matthew [name] has confirmed that his mother has agreed with no feeding tube and comfort care with the assistance of hospice. We are hopeful for hospice facility.   Yong ChannelAlicia Henny Strauch, NP Palliative Medicine Team Pager # [phone] (M-F 8a-5p) Team Phone # [phone] (Nights/Weekends)

## 2014-12-18 DEATH — deceased

## 2016-07-11 IMAGING — CT CT HEAD W/O CM
2 series · 17 of 30 positions shown, 20 images · non-contrast
Comparison: CT head 08/05/2014

CLINICAL DATA: Seizure today

EXAM:
CT HEAD WITHOUT CONTRAST
TECHNIQUE: Contiguous axial images were obtained from the base of the skull
through the vertex without intravenous contrast.

[Series 2: head w/o · axial · non-contrast · 0.45mm/px · z∈[-166,-46]mm · 9 of 32 slices shown, 12 images]
[im 4/32  brain]
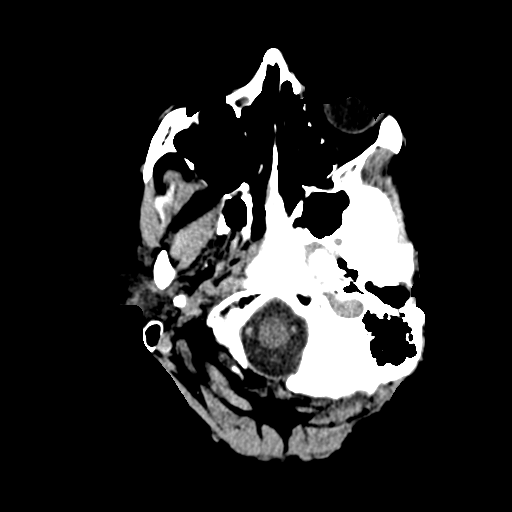
[im 4/32  bone]
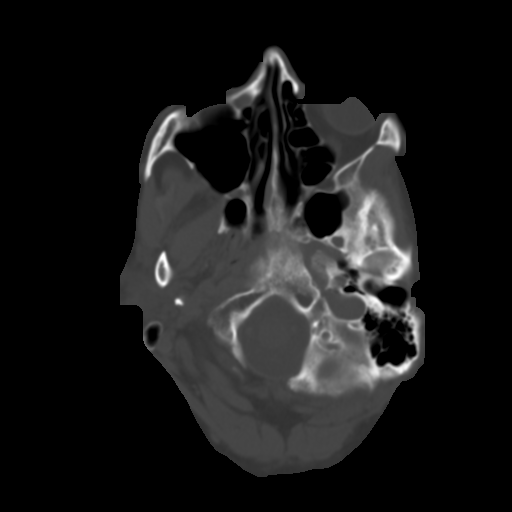
[im 7/32  brain]
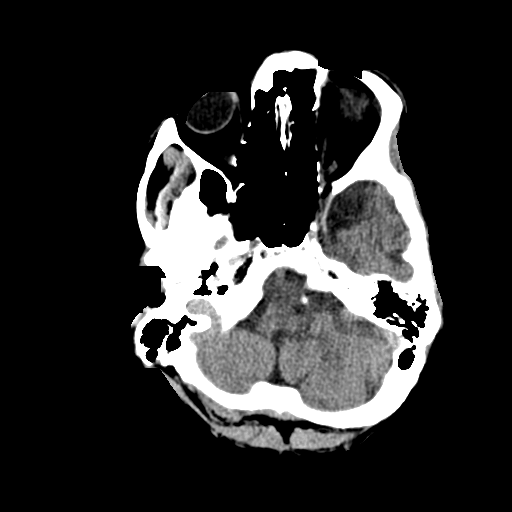
[im 10/32  brain]
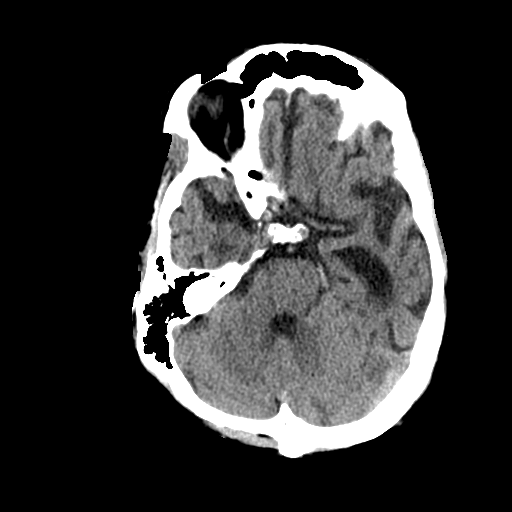
[im 13/32  brain]
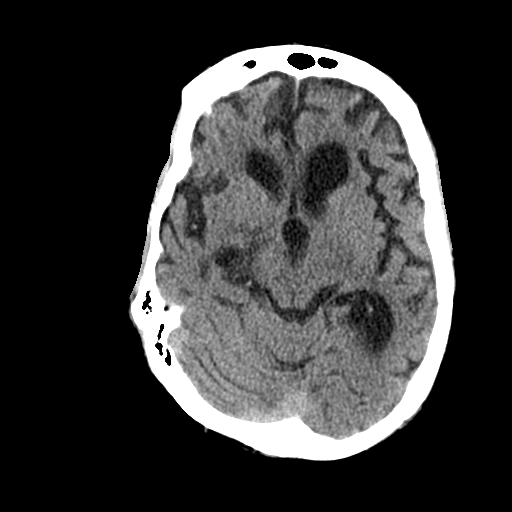
[im 16/32  brain]
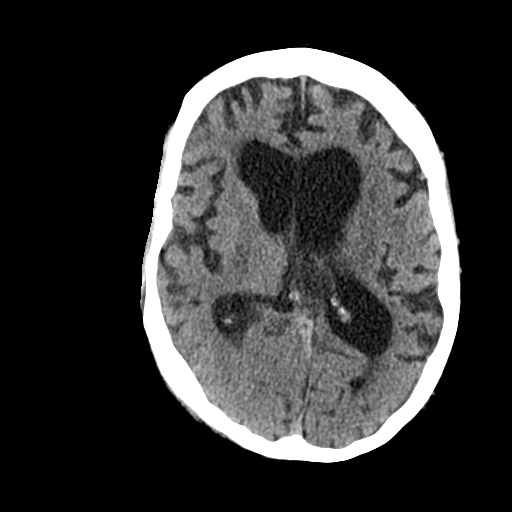
[im 16/32  bone]
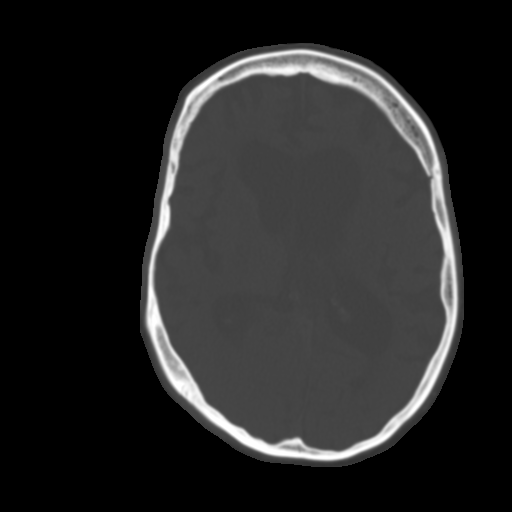
[im 19/32  brain]
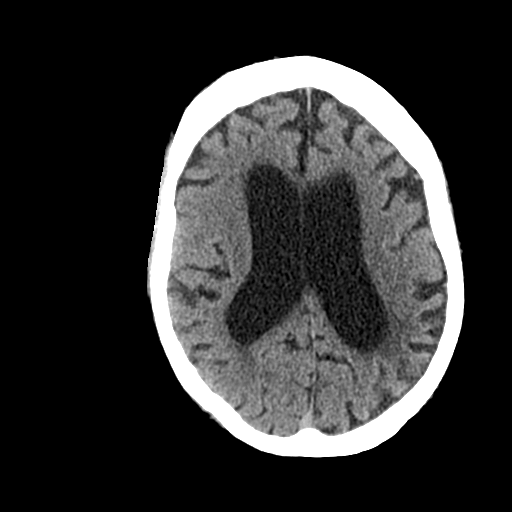
[im 22/32  brain]
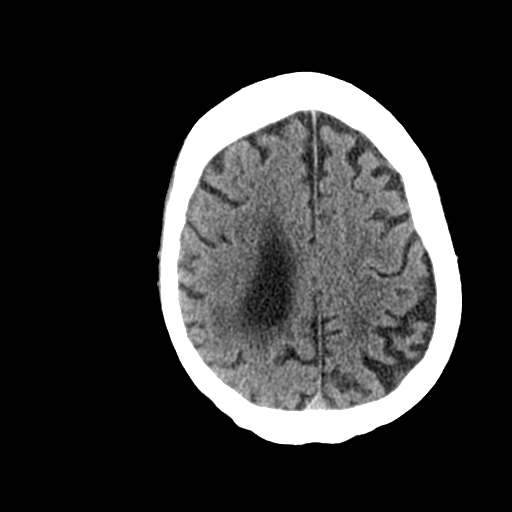
[im 25/32  brain]
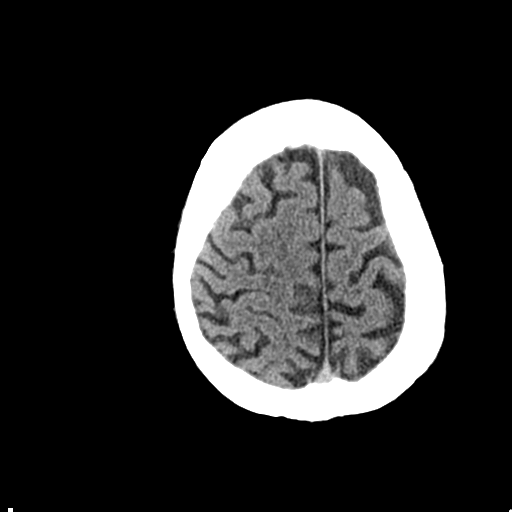
[im 28/32  brain]
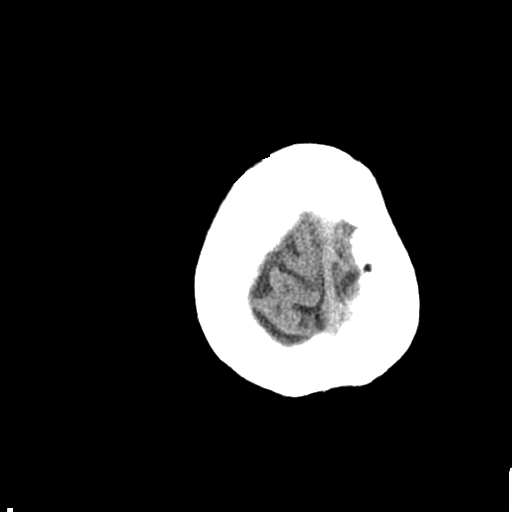
[im 28/32  bone]
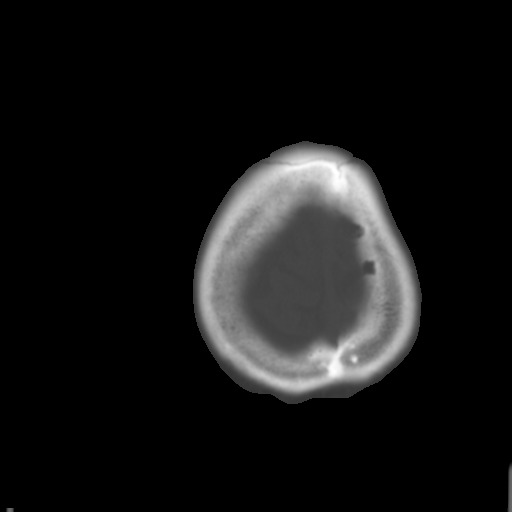

[Series 3: bone windows · axial · 0.45mm/px · z∈[-166,-43]mm · 8 of 53 slices shown]
[im 6/53  bone]
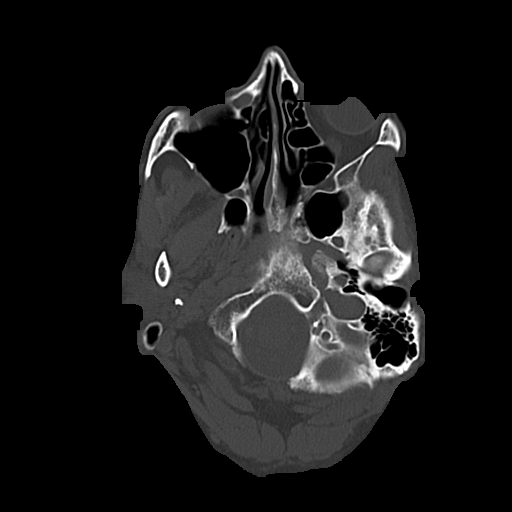
[im 12/53  bone]
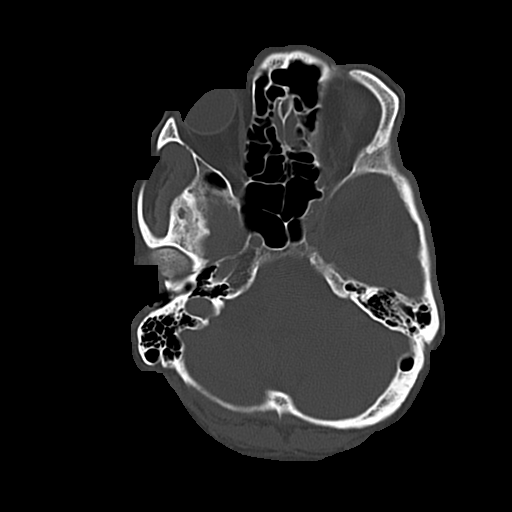
[im 18/53  bone]
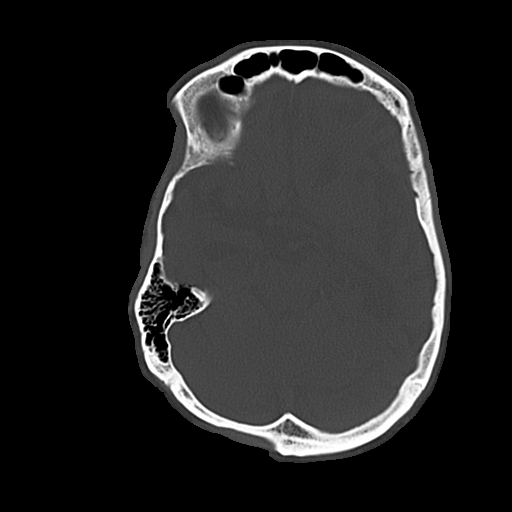
[im 24/53  bone]
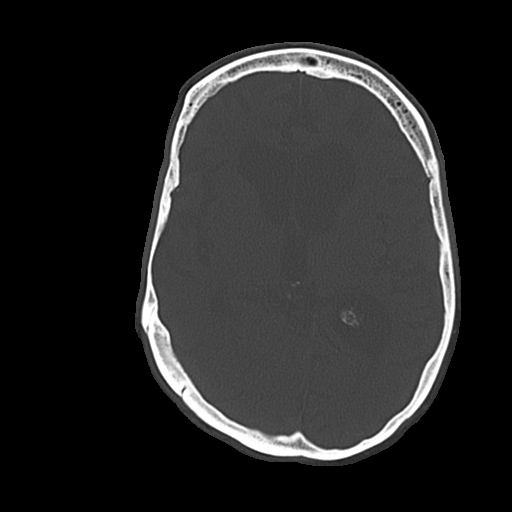
[im 29/53  bone]
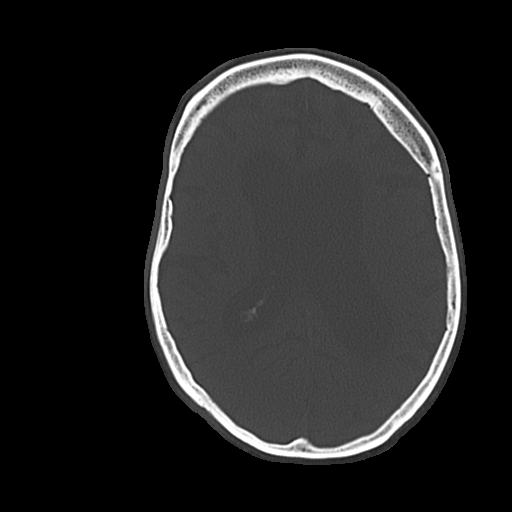
[im 35/53  bone]
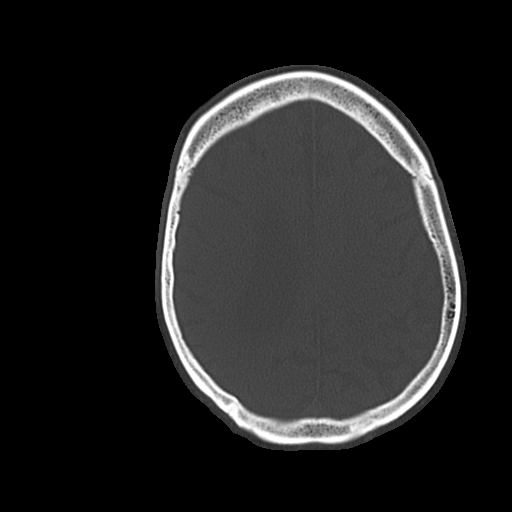
[im 41/53  bone]
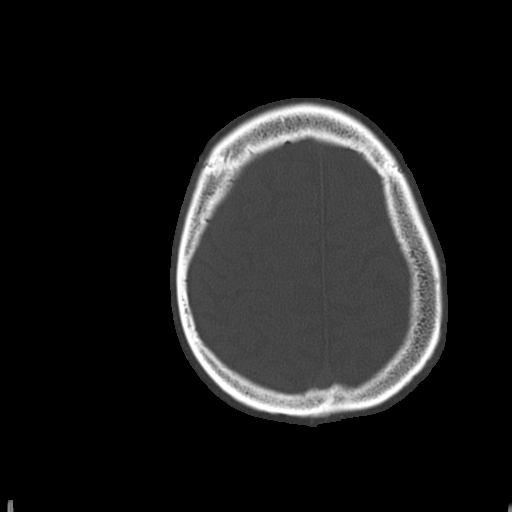
[im 47/53  bone]
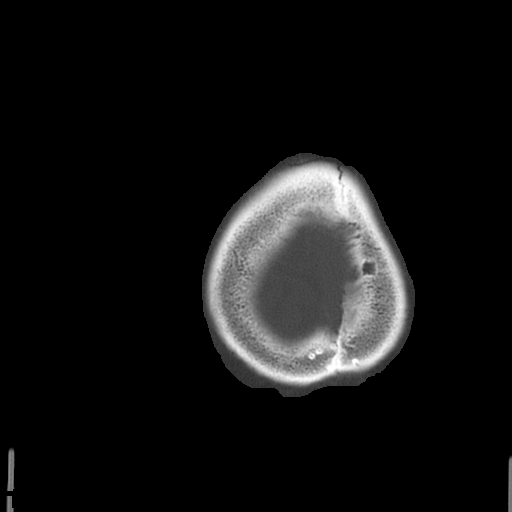

[17 of 30 positions shown; findings below may reference images not displayed]

FINDINGS: Moderate to advanced atrophy unchanged. Chronic microvascular
ischemic change in the white matter. Chronic left parietal infarct
unchanged.

Negative for acute infarct.  Negative for hemorrhage or mass lesion.

Calvarium intact.
IMPRESSION: Atrophy and chronic ischemia. No acute abnormality and no change
from the prior study.
